# Patient Record
Sex: Female | Born: 1950 | Race: Black or African American | Hispanic: No | Marital: Married | Smoking: Former smoker
Health system: Southern US, Community
[De-identification: ages and names within clinical notes are randomized; demographics above are authoritative.]

---

## 1997-03-12 ENCOUNTER — Ambulatory Visit (HOSPITAL_COMMUNITY): Admission: RE | Admit: 1997-03-12 | Discharge: 1997-03-12 | Payer: Self-pay | Admitting: Obstetrics and Gynecology

## 1997-03-13 ENCOUNTER — Ambulatory Visit (HOSPITAL_COMMUNITY): Admission: RE | Admit: 1997-03-13 | Discharge: 1997-03-13 | Payer: Self-pay | Admitting: Dermatology

## 1997-03-17 ENCOUNTER — Ambulatory Visit (HOSPITAL_COMMUNITY): Admission: RE | Admit: 1997-03-17 | Discharge: 1997-03-17 | Payer: Self-pay | Admitting: Obstetrics and Gynecology

## 1997-09-25 ENCOUNTER — Ambulatory Visit (HOSPITAL_COMMUNITY): Admission: RE | Admit: 1997-09-25 | Discharge: 1997-09-25 | Payer: Self-pay | Admitting: Obstetrics and Gynecology

## 1997-12-24 ENCOUNTER — Ambulatory Visit (HOSPITAL_COMMUNITY): Admission: RE | Admit: 1997-12-24 | Discharge: 1997-12-24 | Payer: Self-pay | Admitting: Obstetrics and Gynecology

## 1998-03-11 ENCOUNTER — Other Ambulatory Visit: Admission: RE | Admit: 1998-03-11 | Discharge: 1998-03-11 | Payer: Self-pay | Admitting: Obstetrics and Gynecology

## 1998-07-07 ENCOUNTER — Encounter: Payer: Self-pay | Admitting: Obstetrics and Gynecology

## 1998-07-07 ENCOUNTER — Ambulatory Visit (HOSPITAL_COMMUNITY): Admission: RE | Admit: 1998-07-07 | Discharge: 1998-07-07 | Payer: Self-pay | Admitting: Obstetrics and Gynecology

## 1999-10-27 ENCOUNTER — Other Ambulatory Visit: Admission: RE | Admit: 1999-10-27 | Discharge: 1999-10-27 | Payer: Self-pay | Admitting: Obstetrics and Gynecology

## 2001-06-06 ENCOUNTER — Other Ambulatory Visit: Admission: RE | Admit: 2001-06-06 | Discharge: 2001-06-06 | Payer: Self-pay | Admitting: Obstetrics and Gynecology

## 2001-06-12 ENCOUNTER — Encounter: Payer: Self-pay | Admitting: Obstetrics and Gynecology

## 2001-06-12 ENCOUNTER — Ambulatory Visit (HOSPITAL_COMMUNITY): Admission: RE | Admit: 2001-06-12 | Discharge: 2001-06-12 | Payer: Self-pay | Admitting: Obstetrics and Gynecology

## 2002-04-24 ENCOUNTER — Other Ambulatory Visit: Admission: RE | Admit: 2002-04-24 | Discharge: 2002-04-24 | Payer: Self-pay | Admitting: Obstetrics and Gynecology

## 2002-10-01 ENCOUNTER — Encounter: Payer: Self-pay | Admitting: Obstetrics and Gynecology

## 2002-10-01 ENCOUNTER — Ambulatory Visit (HOSPITAL_COMMUNITY): Admission: RE | Admit: 2002-10-01 | Discharge: 2002-10-01 | Payer: Self-pay | Admitting: Obstetrics and Gynecology

## 2003-11-11 ENCOUNTER — Other Ambulatory Visit: Admission: RE | Admit: 2003-11-11 | Discharge: 2003-11-11 | Payer: Self-pay | Admitting: Obstetrics and Gynecology

## 2003-12-08 ENCOUNTER — Ambulatory Visit (HOSPITAL_COMMUNITY): Admission: RE | Admit: 2003-12-08 | Discharge: 2003-12-08 | Payer: Self-pay | Admitting: Obstetrics and Gynecology

## 2005-03-21 ENCOUNTER — Ambulatory Visit (HOSPITAL_COMMUNITY): Admission: RE | Admit: 2005-03-21 | Discharge: 2005-03-21 | Payer: Self-pay | Admitting: Obstetrics and Gynecology

## 2005-05-06 ENCOUNTER — Ambulatory Visit (HOSPITAL_COMMUNITY): Admission: RE | Admit: 2005-05-06 | Discharge: 2005-05-06 | Payer: Self-pay | Admitting: Allergy

## 2005-12-07 ENCOUNTER — Other Ambulatory Visit: Admission: RE | Admit: 2005-12-07 | Discharge: 2005-12-07 | Payer: Self-pay | Admitting: Obstetrics and Gynecology

## 2007-04-18 ENCOUNTER — Ambulatory Visit (HOSPITAL_COMMUNITY): Admission: RE | Admit: 2007-04-18 | Discharge: 2007-04-18 | Payer: Self-pay | Admitting: Family Medicine

## 2007-04-20 ENCOUNTER — Ambulatory Visit (HOSPITAL_COMMUNITY): Admission: RE | Admit: 2007-04-20 | Discharge: 2007-04-20 | Payer: Self-pay | Admitting: Family Medicine

## 2008-04-21 ENCOUNTER — Ambulatory Visit (HOSPITAL_COMMUNITY): Admission: RE | Admit: 2008-04-21 | Discharge: 2008-04-21 | Payer: Self-pay | Admitting: Obstetrics and Gynecology

## 2008-06-24 ENCOUNTER — Encounter: Admission: RE | Admit: 2008-06-24 | Discharge: 2008-06-24 | Payer: Self-pay | Admitting: Obstetrics and Gynecology

## 2010-02-21 ENCOUNTER — Encounter: Payer: Self-pay | Admitting: Obstetrics and Gynecology

## 2010-03-04 ENCOUNTER — Other Ambulatory Visit (HOSPITAL_COMMUNITY): Payer: Self-pay | Admitting: Obstetrics and Gynecology

## 2010-03-04 DIAGNOSIS — Z139 Encounter for screening, unspecified: Secondary | ICD-10-CM

## 2010-03-08 ENCOUNTER — Ambulatory Visit (HOSPITAL_COMMUNITY)
Admission: RE | Admit: 2010-03-08 | Discharge: 2010-03-08 | Disposition: A | Discharge status: Home / Self Care | Payer: BC Managed Care – PPO | Source: Ambulatory Visit | Attending: Obstetrics and Gynecology | Admitting: Obstetrics and Gynecology

## 2010-03-08 DIAGNOSIS — Z1231 Encounter for screening mammogram for malignant neoplasm of breast: Secondary | ICD-10-CM | POA: Insufficient documentation

## 2010-03-08 DIAGNOSIS — Z139 Encounter for screening, unspecified: Secondary | ICD-10-CM

## 2013-11-18 ENCOUNTER — Other Ambulatory Visit (HOSPITAL_COMMUNITY): Payer: Self-pay | Admitting: Family Medicine

## 2013-11-18 DIAGNOSIS — Z1231 Encounter for screening mammogram for malignant neoplasm of breast: Secondary | ICD-10-CM

## 2013-11-25 ENCOUNTER — Ambulatory Visit (HOSPITAL_COMMUNITY)
Admission: RE | Admit: 2013-11-25 | Discharge: 2013-11-25 | Disposition: A | Discharge status: Home / Self Care | Payer: PRIVATE HEALTH INSURANCE | Source: Ambulatory Visit | Attending: Family Medicine | Admitting: Family Medicine

## 2013-11-25 DIAGNOSIS — Z1231 Encounter for screening mammogram for malignant neoplasm of breast: Secondary | ICD-10-CM | POA: Insufficient documentation

## 2015-12-10 ENCOUNTER — Encounter: Payer: PRIVATE HEALTH INSURANCE | Admitting: Neurology

## 2015-12-17 ENCOUNTER — Encounter: Payer: PRIVATE HEALTH INSURANCE | Admitting: Neurology

## 2016-10-26 DIAGNOSIS — L503 Dermatographic urticaria: Secondary | ICD-10-CM | POA: Insufficient documentation

## 2017-02-22 DIAGNOSIS — Z9109 Other allergy status, other than to drugs and biological substances: Secondary | ICD-10-CM | POA: Diagnosis not present

## 2017-02-22 DIAGNOSIS — L509 Urticaria, unspecified: Secondary | ICD-10-CM | POA: Diagnosis not present

## 2017-02-22 DIAGNOSIS — L281 Prurigo nodularis: Secondary | ICD-10-CM | POA: Diagnosis not present

## 2017-02-22 DIAGNOSIS — L503 Dermatographic urticaria: Secondary | ICD-10-CM | POA: Diagnosis not present

## 2017-02-22 DIAGNOSIS — L209 Atopic dermatitis, unspecified: Secondary | ICD-10-CM | POA: Diagnosis not present

## 2017-03-07 DIAGNOSIS — I10 Essential (primary) hypertension: Secondary | ICD-10-CM | POA: Diagnosis not present

## 2017-03-07 DIAGNOSIS — R7309 Other abnormal glucose: Secondary | ICD-10-CM | POA: Diagnosis not present

## 2017-03-07 DIAGNOSIS — G4700 Insomnia, unspecified: Secondary | ICD-10-CM | POA: Diagnosis not present

## 2017-03-07 DIAGNOSIS — E781 Pure hyperglyceridemia: Secondary | ICD-10-CM | POA: Diagnosis not present

## 2017-03-09 DIAGNOSIS — I10 Essential (primary) hypertension: Secondary | ICD-10-CM | POA: Diagnosis not present

## 2017-03-09 DIAGNOSIS — E782 Mixed hyperlipidemia: Secondary | ICD-10-CM | POA: Diagnosis not present

## 2017-03-09 DIAGNOSIS — R7309 Other abnormal glucose: Secondary | ICD-10-CM | POA: Diagnosis not present

## 2017-03-24 DIAGNOSIS — R7309 Other abnormal glucose: Secondary | ICD-10-CM | POA: Diagnosis not present

## 2017-03-24 DIAGNOSIS — I10 Essential (primary) hypertension: Secondary | ICD-10-CM | POA: Diagnosis not present

## 2017-03-24 DIAGNOSIS — E782 Mixed hyperlipidemia: Secondary | ICD-10-CM | POA: Diagnosis not present

## 2017-04-25 DIAGNOSIS — J069 Acute upper respiratory infection, unspecified: Secondary | ICD-10-CM | POA: Diagnosis not present

## 2017-07-21 ENCOUNTER — Other Ambulatory Visit: Payer: Self-pay

## 2017-07-21 DIAGNOSIS — I10 Essential (primary) hypertension: Secondary | ICD-10-CM | POA: Diagnosis not present

## 2017-07-21 DIAGNOSIS — R7309 Other abnormal glucose: Secondary | ICD-10-CM | POA: Diagnosis not present

## 2017-07-21 DIAGNOSIS — E782 Mixed hyperlipidemia: Secondary | ICD-10-CM | POA: Diagnosis not present

## 2017-07-21 NOTE — Patient Outreach (Signed)
[facility]) Care Management  07/21/2017  Virginia Turner May 28, 1950 [id number]   Medication Adherence call to Mrs. Virginia Turner left a message for patient to call back patient is due on Rosuvastatin 20 mg and Valsartan Hctz 160/25. Mrs. Virginia Turner is showing past due under [facility].    Virginia AbedAna Turner CPhT Pharmacy Technician [facility] Care Management Direct Dial [phone]  Fax [phone] [email or web address]

## 2017-07-24 DIAGNOSIS — I10 Essential (primary) hypertension: Secondary | ICD-10-CM | POA: Diagnosis not present

## 2017-07-24 DIAGNOSIS — R7309 Other abnormal glucose: Secondary | ICD-10-CM | POA: Diagnosis not present

## 2017-07-24 DIAGNOSIS — E782 Mixed hyperlipidemia: Secondary | ICD-10-CM | POA: Diagnosis not present

## 2017-08-04 DIAGNOSIS — Z0000 Encounter for general adult medical examination without abnormal findings: Secondary | ICD-10-CM | POA: Diagnosis not present

## 2017-08-22 ENCOUNTER — Other Ambulatory Visit: Payer: Self-pay

## 2017-08-22 NOTE — Patient Outreach (Signed)
[facility]) Care Management  08/22/2017  Virginia CulverBenita L Turner 1950-05-12 [id number]   Medication Adherence call to [name] spoke with patient she ask if we can contact doctor's office for more refill she also wants a 90 days supply on both medication Rosuvastatin 20 mg and Valsartan/ Hctz 100/25.left a message at doctor's office for [facility]. Patient is showing past due under [facility].   Lillia AbedAna Ollison-Moran CPhT Pharmacy Technician [facility] Care Management Direct Dial [phone]  Fax [phone] [email or web address]

## 2017-10-16 DIAGNOSIS — Z1211 Encounter for screening for malignant neoplasm of colon: Secondary | ICD-10-CM | POA: Diagnosis not present

## 2017-11-14 DIAGNOSIS — Z1231 Encounter for screening mammogram for malignant neoplasm of breast: Secondary | ICD-10-CM | POA: Diagnosis not present

## 2017-11-17 DIAGNOSIS — R922 Inconclusive mammogram: Secondary | ICD-10-CM | POA: Diagnosis not present

## 2017-11-20 DIAGNOSIS — E781 Pure hyperglyceridemia: Secondary | ICD-10-CM | POA: Diagnosis not present

## 2017-11-20 DIAGNOSIS — E782 Mixed hyperlipidemia: Secondary | ICD-10-CM | POA: Diagnosis not present

## 2017-11-20 DIAGNOSIS — I10 Essential (primary) hypertension: Secondary | ICD-10-CM | POA: Diagnosis not present

## 2017-11-20 DIAGNOSIS — R799 Abnormal finding of blood chemistry, unspecified: Secondary | ICD-10-CM | POA: Diagnosis not present

## 2017-11-20 DIAGNOSIS — R7309 Other abnormal glucose: Secondary | ICD-10-CM | POA: Diagnosis not present

## 2017-11-22 DIAGNOSIS — I10 Essential (primary) hypertension: Secondary | ICD-10-CM | POA: Diagnosis not present

## 2017-11-22 DIAGNOSIS — E1100 Type 2 diabetes mellitus with hyperosmolarity without nonketotic hyperglycemic-hyperosmolar coma (NKHHC): Secondary | ICD-10-CM | POA: Diagnosis not present

## 2017-11-22 DIAGNOSIS — E785 Hyperlipidemia, unspecified: Secondary | ICD-10-CM | POA: Diagnosis not present

## 2018-01-15 DIAGNOSIS — J069 Acute upper respiratory infection, unspecified: Secondary | ICD-10-CM | POA: Diagnosis not present

## 2018-02-15 DIAGNOSIS — J9801 Acute bronchospasm: Secondary | ICD-10-CM | POA: Diagnosis not present

## 2018-02-28 DIAGNOSIS — E785 Hyperlipidemia, unspecified: Secondary | ICD-10-CM | POA: Diagnosis not present

## 2018-02-28 DIAGNOSIS — G4700 Insomnia, unspecified: Secondary | ICD-10-CM | POA: Diagnosis not present

## 2018-02-28 DIAGNOSIS — R7309 Other abnormal glucose: Secondary | ICD-10-CM | POA: Diagnosis not present

## 2018-02-28 DIAGNOSIS — I10 Essential (primary) hypertension: Secondary | ICD-10-CM | POA: Diagnosis not present

## 2018-02-28 DIAGNOSIS — E1100 Type 2 diabetes mellitus with hyperosmolarity without nonketotic hyperglycemic-hyperosmolar coma (NKHHC): Secondary | ICD-10-CM | POA: Diagnosis not present

## 2018-03-02 DIAGNOSIS — E782 Mixed hyperlipidemia: Secondary | ICD-10-CM | POA: Diagnosis not present

## 2018-03-02 DIAGNOSIS — I10 Essential (primary) hypertension: Secondary | ICD-10-CM | POA: Diagnosis not present

## 2018-04-05 ENCOUNTER — Other Ambulatory Visit: Payer: Self-pay

## 2018-04-05 NOTE — Patient Outreach (Signed)
[facility]) Care Management  04/05/2018  Virginia Turner 03-11-1950 [id number]   Medication Adherence call to [name] patient did not answer patient is due on Janumet XR 50/500 mg, pharmacy said last time she pick up was on 2/11 for a 23 tablets. [name] is showing past due under united Health Care Ins.   Lillia Abed CPhT Pharmacy Technician [facility] Care Management Direct Dial [phone]  Fax [phone] [email or web address]

## 2018-05-21 ENCOUNTER — Other Ambulatory Visit: Payer: Self-pay

## 2018-05-21 NOTE — Patient Outreach (Signed)
[facility]) Care Management  05/21/2018  Virginia Turner 06-Sep-1950 [id number]   Medication Adherence call to Mrs. Virginia Turner [employer] Verify spoke with patient she is due on Janumet Rx 50/500 mg patient explain she already order and will be pick up from the pharmacy patient also said medication has gone up and wants to know if she can apply for Patients Assistance I will send it to Caryn Bee rph to better assist her with this. Virginia Turner is showing past due under [facility] Ins.

## 2018-06-07 ENCOUNTER — Other Ambulatory Visit: Payer: Self-pay | Admitting: Pharmacist

## 2018-06-07 ENCOUNTER — Other Ambulatory Visit: Payer: Self-pay | Admitting: Pharmacy Technician

## 2018-06-07 ENCOUNTER — Other Ambulatory Visit: Payer: Self-pay

## 2018-06-07 NOTE — Patient Outreach (Addendum)
[facility]) Care Management  [facility] Pharmacy   06/07/2018  Virginia Turner 1950-12-17 [id number]  Subjective:  Message received from pharmacy technician, Darien Ramus, patient needs assistance paying for Janumet XR, prescribed by Dr Lowella Bandy.    Successful phone outreach to patient, HIPAA details verified.  Patient expresses Janumet XR and Vascepa are expensive.  In fact patient reports she is supposed to take Vascepa 1 capsule 3 times daily but only taking 1 capsule once daily due to cost.       Objective:   Current Medications: Current Outpatient Medications  Medication Sig Dispense Refill  . Icosapent Ethyl (VASCEPA) 1 g CAPS Take 1 capsule by mouth daily.    . rosuvastatin (CRESTOR) 40 MG tablet Take 40 mg by mouth daily.    . SitaGLIPtin-MetFORMIN HCl (JANUMET XR) 50-500 MG TB24 Take 1 tablet by mouth daily.    . valsartan-hydrochlorothiazide (DIOVAN-HCT) 160-25 MG tablet Take 1 tablet by mouth daily.     No current facility-administered medications for this visit.     Assessment:  1) Medication adherence---due to cost, patient unable to be able adherent to Vascepa per her report.   Patient consented to phone call to [facility].  Contacted [employer] as a three way call with patient---patient was approved for a $2,500 grant from [employer] for hypercholesterolemia to assist with co-pay.  Patient was provided with the pharmacy card information by [facility] representative and representative instructed patient she will receive a letter with the details of her grant in a bright orange envelope in the mail in the next 7-10 business days.   2) Janumet XR cost: -Discussed [employer] Patient assistance program---program believes income will meet requirements and would like to apply to see if eligible.   Patient understands there is no guarantee she will be eligible.    Plan:  Will notify Dr Parke Simmers patient has been unable to be  adherent to Vascepa due to cost and this has been remedied with [employer].   Will have pharmacy technician Jill assist patient with compiling [employer] patient assistance application.   Address in this chart verified as accurate per patient.   Note routed to Dr Parke Simmers.   Tommye Standard, PharmD, [facility] Clinical Pharmacist [facility] [phone]

## 2018-06-07 NOTE — Patient Outreach (Signed)
[facility]) Care Management  06/07/2018  Virginia Turner 08-Jun-1950 [id number]   Medication Adherence call to Virginia Turner [employer] Verify spoke with patient she is due on Rosuvastatin 40 mg she explain she is taking 1 tablet daily and has plenty for about 3 month patient did ask again on Janumet 5/500 mg she wants to know if she can get Patient's assistance on this medication told her kevin(rph) will give her a call back. Virginia Turner is showing past due under [facility] Ins.   Lillia Abed CPhT Pharmacy Technician [facility] Management Direct Dial [phone]  Fax [phone] [email or web address]

## 2018-06-07 NOTE — Patient Outreach (Signed)
[facility]) Care Management  06/07/2018  Virginia Turner 01-18-1951 [id number]                           Medication Assistance Referral  Referral From: [facility] RPh Teena Irani  Medication/Company: Thera Flake XR / [employer] Patient application portion:  Mailed Provider application portion:  Mailed to Dr. Renaye Rakers    Follow up:  Will follow up with patient in 5-10 business days to confirm application(s) have been received.  [name] P. [name], CPhT Musician Care Management [phone]

## 2018-06-18 ENCOUNTER — Other Ambulatory Visit: Payer: Self-pay | Admitting: Pharmacy Technician

## 2018-06-18 NOTE — Patient Outreach (Signed)
[facility]) Care Management  06/18/2018  Virginia Turner [birth year] [id number]   Unsuccessful outreach call placed to patient in regards to [employer] application for Janumet XR.  Unfortunately patient did not answer the phone, a voicemail box had not been set up so a voicemail could not be left. Attempted to call the mobile number listed for the patient, but the person's voicemail name did not match the patient's name.  Was calling patient to inquire if she had received the application that was mailed to her on 06/07/2018.  Will followup with patient in 3-5 business days.  Ed Mandich P. Kambrey Hagger, CPhT Musician Care Management [phone]

## 2018-06-21 ENCOUNTER — Other Ambulatory Visit: Payer: Self-pay | Admitting: Pharmacy Technician

## 2018-06-21 NOTE — Patient Outreach (Signed)
[facility]) Care Management  06/21/2018  Virginia Turner 06/29/1950 [id number]  Successful outreach call placed to patient in regards to [employer] patient assistance application for Janumet XR.  Spoke to patient, HIPAA identifiers verified.  Patient informed she had received the application and placed it in the mail last week.  Will followup with patient in 7-14 business days if application has not been received.  Ginia Rudell P. Charlen Bakula, CPhT Musician Care Management [phone]

## 2018-06-27 DIAGNOSIS — R7309 Other abnormal glucose: Secondary | ICD-10-CM | POA: Diagnosis not present

## 2018-06-27 DIAGNOSIS — I10 Essential (primary) hypertension: Secondary | ICD-10-CM | POA: Diagnosis not present

## 2018-06-27 DIAGNOSIS — E782 Mixed hyperlipidemia: Secondary | ICD-10-CM | POA: Diagnosis not present

## 2018-06-28 ENCOUNTER — Other Ambulatory Visit: Payer: Self-pay | Admitting: Pharmacy Technician

## 2018-06-28 NOTE — Patient Outreach (Signed)
[facility]) Care Management  06/28/2018  Virginia Turner [birth year] [id number]    Received all necessary documents and signatures from both patient and provider for [employer] patient assistance for Janumet.  Submitted completed application via mail to [employer].  Will followup with [employer] in 10-14 business days to inquire on status of application and to inquire if they have mailed out the attestation form. Patient is aware to be expecting a letter from [employer] and has been informed previously to call me when she receives that letter.  Blakeley Margraf P. Jayveion Stalling, CPhT Musician Care Management [phone]

## 2018-06-29 DIAGNOSIS — E785 Hyperlipidemia, unspecified: Secondary | ICD-10-CM | POA: Diagnosis not present

## 2018-06-29 DIAGNOSIS — I10 Essential (primary) hypertension: Secondary | ICD-10-CM | POA: Diagnosis not present

## 2018-06-29 DIAGNOSIS — E1169 Type 2 diabetes mellitus with other specified complication: Secondary | ICD-10-CM | POA: Diagnosis not present

## 2018-07-11 ENCOUNTER — Other Ambulatory Visit: Payer: Self-pay | Admitting: Pharmacy Technician

## 2018-07-11 NOTE — Patient Outreach (Signed)
[place] [facility]) Care Management  07/11/2018  Virginia Turner Jun 03, 1950 [id number]   Incoming call received from patient in regards to [employer] patient assistance application for Janument XR.  Spoke to patient, HIPAA identifiers verified.  Patient was calling to inform me that she received her [employer] Naval architect. Discussed the attestation letter with the patient. Patient was able to complete the questions. Patent informed she would place in the mail for pickup tommorrow   Will followup with [employer] in 7-10 business days to inquire if they have received the attestation letter back.  Maleka Contino P. Holdan Stucke, [place] Management [phone]

## 2018-07-19 DIAGNOSIS — E119 Type 2 diabetes mellitus without complications: Secondary | ICD-10-CM | POA: Diagnosis not present

## 2018-08-01 ENCOUNTER — Other Ambulatory Visit: Payer: Self-pay | Admitting: Pharmacy Technician

## 2018-08-01 NOTE — Patient Outreach (Signed)
[place] [facility]) Care Management  08/01/2018  Virginia Turner 29-May-1950 [id number]   Care coordination call placed to [employer] in regards to patient's application for Janumet XR.  Spoke to [place] who informed patient was APPROVED for the program 6/152929-01/31/2019. Adonis Huguenin informed patient should be receiving a 90 days supply of the medication on 08/02/2018.  Will followup with patient in 3-5 business days to confirm receipt of medication.  Virginia Turner, [place] Management [phone]

## 2018-08-06 ENCOUNTER — Other Ambulatory Visit: Payer: Self-pay | Admitting: Pharmacy Technician

## 2018-08-06 NOTE — Patient Outreach (Signed)
[place] [facility]) Care Management  08/06/2018  Virginia Turner 23-Mar-1950 [id number]    Successful outreach call placed to patient in regards to [employer] application for Janumet XR.  Spoke to patient, HIPAA identifiers verified.  Patient informed she has not received the Janumet. Informed patient that the representative form [employer] said the medication should have arrived on 08/02/2018. Informed patient I would call [employer].  Ismelda Weatherman P. Reaghan Kawa, [place] Management [phone]

## 2018-08-06 NOTE — Patient Outreach (Signed)
[place] [facility]) Care Management  08/06/2018  Virginia Turner [birth year] [id number]   ADDENDUM  Successful outreach call placed to patient in regards to Janumet XR application with [employer].  Spoke to patient, HIPAA identifiers verified.  Informed patient that the [employer] representative says her package should arrive today. Patient to outreach me once she has received the medication.  Will followup with patient in 3-7 business days if call is not returned.  [name] P. Celise Bazar, [place] Management [phone]

## 2018-08-06 NOTE — Patient Outreach (Signed)
[place] [facility]) Care Management  08/06/2018  Virginia Turner 01-Apr-1950 [id number]    Care coordination call placed to [employer] in regards to patient's Janumet application.  Spoke to Angelica who said the package is estimated to arrive by end of business today via [place]. The tracking number is [id number].  Will outreach patient with this information.  Luciann Gossett P. Kaityln Kallstrom, [place] Management [phone]

## 2018-08-07 ENCOUNTER — Other Ambulatory Visit: Payer: Self-pay | Admitting: Pharmacy Technician

## 2018-08-07 NOTE — Patient Outreach (Signed)
[place] [facility]) Care Management  08/07/2018  NIOKA THORINGTON [birth year] [id number]   Incoming call received from patient, HIPAA identifiers verified.  Patient was returning call from yesterday. Patient called to inform that she received 90 days supply of Janumet XR. Discussed refill procedure with patient. Patient verbalized understanding.  Inquired if patient had any other questions or concerns for myself or pharmacist and patient informed no. Confirmed patient had our name and number for future reference.  Will route note to [place] that patient assistance has been completed and case can be closed. Will remove myself from care team.  Luiz Ochoa. Mort Smelser, [place] Management [phone]

## 2018-08-13 ENCOUNTER — Other Ambulatory Visit: Payer: Self-pay | Admitting: Pharmacist

## 2018-08-13 NOTE — Patient Outreach (Signed)
[place] [facility]) Care Management  08/13/2018  Virginia Turner 1950-08-18 [id number]  Received message from pharmacy technician Sharee Pimple that patient has received Janumet XR via [employer] patient assistance program, see note in this chart from 08/07/2018.  Patient was initially referred for medication assistance evaluation and this has now been successfully obtained for Vascepa and Janumet XR.   Plan:  Case closed.   Will send update to primary care provider, Dr Clayburn Pert. Karrie Meres, PharmD, [place] [phone]

## 2018-09-25 ENCOUNTER — Other Ambulatory Visit: Payer: Self-pay

## 2018-09-25 NOTE — Patient Outreach (Signed)
[place] [facility]) Care Management  09/25/2018  Virginia Turner [birth year] [id number]   Medication Adherence call to Virginia Turner [employer] Verify spoke with patient she is past due on Rosuvastatin 40 mg patient explain she take 1 tablet daily and has a few left she also has one ready at the pharmacy and will pick up soon. Virginia Turner is showing past due under [place].   [place] Management Direct Dial [phone]  Fax [phone] [email or web address]

## 2018-10-16 ENCOUNTER — Other Ambulatory Visit: Payer: Self-pay

## 2018-10-16 NOTE — Patient Outreach (Signed)
[place]) Care Management  10/16/2018  Virginia Turner July 12, 1950 [id number]   Medication Adherence call to Virginia Turner [employer] Verify spoke with patient she is past due on Rosuvastatin 40 mg patient explain she takes 1 tablet daily and has enough for 7 more days patient [facility] this medication along Valsartan/Hctz.[facility] patient. Virginia Turner is showing up past due under [place].   [place] Management Direct Dial [phone]  Fax [phone] [email or web address]

## 2018-10-24 DIAGNOSIS — E1169 Type 2 diabetes mellitus with other specified complication: Secondary | ICD-10-CM | POA: Diagnosis not present

## 2018-10-24 DIAGNOSIS — I10 Essential (primary) hypertension: Secondary | ICD-10-CM | POA: Diagnosis not present

## 2018-10-24 DIAGNOSIS — E785 Hyperlipidemia, unspecified: Secondary | ICD-10-CM | POA: Diagnosis not present

## 2018-10-26 DIAGNOSIS — I10 Essential (primary) hypertension: Secondary | ICD-10-CM | POA: Diagnosis not present

## 2018-10-26 DIAGNOSIS — G5602 Carpal tunnel syndrome, left upper limb: Secondary | ICD-10-CM | POA: Diagnosis not present

## 2018-10-26 DIAGNOSIS — E1169 Type 2 diabetes mellitus with other specified complication: Secondary | ICD-10-CM | POA: Diagnosis not present

## 2019-01-22 DIAGNOSIS — R200 Anesthesia of skin: Secondary | ICD-10-CM | POA: Diagnosis not present

## 2019-01-31 DIAGNOSIS — Z1231 Encounter for screening mammogram for malignant neoplasm of breast: Secondary | ICD-10-CM | POA: Diagnosis not present

## 2019-01-31 DIAGNOSIS — M810 Age-related osteoporosis without current pathological fracture: Secondary | ICD-10-CM | POA: Diagnosis not present

## 2019-02-21 DIAGNOSIS — I10 Essential (primary) hypertension: Secondary | ICD-10-CM | POA: Diagnosis not present

## 2019-02-21 DIAGNOSIS — E782 Mixed hyperlipidemia: Secondary | ICD-10-CM | POA: Diagnosis not present

## 2019-02-21 DIAGNOSIS — R200 Anesthesia of skin: Secondary | ICD-10-CM | POA: Diagnosis not present

## 2019-02-21 DIAGNOSIS — E1169 Type 2 diabetes mellitus with other specified complication: Secondary | ICD-10-CM | POA: Diagnosis not present

## 2019-02-21 DIAGNOSIS — G5602 Carpal tunnel syndrome, left upper limb: Secondary | ICD-10-CM | POA: Diagnosis not present

## 2019-02-25 DIAGNOSIS — E1169 Type 2 diabetes mellitus with other specified complication: Secondary | ICD-10-CM | POA: Diagnosis not present

## 2019-02-25 DIAGNOSIS — I10 Essential (primary) hypertension: Secondary | ICD-10-CM | POA: Diagnosis not present

## 2019-02-25 DIAGNOSIS — E785 Hyperlipidemia, unspecified: Secondary | ICD-10-CM | POA: Diagnosis not present

## 2019-06-21 DIAGNOSIS — E1169 Type 2 diabetes mellitus with other specified complication: Secondary | ICD-10-CM | POA: Diagnosis not present

## 2019-06-21 DIAGNOSIS — I10 Essential (primary) hypertension: Secondary | ICD-10-CM | POA: Diagnosis not present

## 2019-06-21 DIAGNOSIS — E785 Hyperlipidemia, unspecified: Secondary | ICD-10-CM | POA: Diagnosis not present

## 2019-06-25 DIAGNOSIS — I10 Essential (primary) hypertension: Secondary | ICD-10-CM | POA: Diagnosis not present

## 2019-06-25 DIAGNOSIS — E1169 Type 2 diabetes mellitus with other specified complication: Secondary | ICD-10-CM | POA: Diagnosis not present

## 2019-06-25 DIAGNOSIS — E782 Mixed hyperlipidemia: Secondary | ICD-10-CM | POA: Diagnosis not present

## 2019-06-25 DIAGNOSIS — L259 Unspecified contact dermatitis, unspecified cause: Secondary | ICD-10-CM | POA: Diagnosis not present

## 2019-06-26 ENCOUNTER — Other Ambulatory Visit: Payer: Self-pay | Admitting: Pharmacy Technician

## 2019-06-26 NOTE — Patient Outreach (Signed)
[facility]) Care Management  06/26/2019  Virginia Turner 09/28/1950 [id number]  Received the following email from Caron Presume, AA with CM: Good afternoon Virginia Turner, I received a call from [name] (DOB: January 30, 1951).  Her phone number is [phone].  She would like a call back regarding some prescription questions she has. Have a good afternoon. ?? Thank you, Caron Presume  Successful outreach call placed to patient, HIPAA identfers verified.  Patient informed she was in need of renewing her application for Janumet with [employer] and Vascepa with the [name]. Patient informed she now has [name] and her primary care provider was [name]. Inquired when patient had last appointment with PCP as they have an embedded [employer] pharmacist in the clinic. Patient informed she was there this week and she spoke to the pharmacist. Informed patient to outreach that pharmacist for assistance. Informed patient to outreach me if that pharmacist was unable to assist the patient. Patient verbalized understanding and appreciated the call back.  Tericka Devincenzi P. [place], CPhT [facility] [employer]  [phone]

## 2019-07-01 DIAGNOSIS — E1100 Type 2 diabetes mellitus with hyperosmolarity without nonketotic hyperglycemic-hyperosmolar coma (NKHHC): Secondary | ICD-10-CM | POA: Diagnosis not present

## 2019-07-01 DIAGNOSIS — I10 Essential (primary) hypertension: Secondary | ICD-10-CM | POA: Diagnosis not present

## 2019-07-01 DIAGNOSIS — E785 Hyperlipidemia, unspecified: Secondary | ICD-10-CM | POA: Diagnosis not present

## 2019-07-01 DIAGNOSIS — Z7984 Long term (current) use of oral hypoglycemic drugs: Secondary | ICD-10-CM | POA: Diagnosis not present

## 2019-10-08 DIAGNOSIS — Z0000 Encounter for general adult medical examination without abnormal findings: Secondary | ICD-10-CM | POA: Diagnosis not present

## 2019-10-08 DIAGNOSIS — L237 Allergic contact dermatitis due to plants, except food: Secondary | ICD-10-CM | POA: Diagnosis not present

## 2019-10-17 DIAGNOSIS — H01131 Eczematous dermatitis of right upper eyelid: Secondary | ICD-10-CM | POA: Diagnosis not present

## 2019-10-17 DIAGNOSIS — L237 Allergic contact dermatitis due to plants, except food: Secondary | ICD-10-CM | POA: Diagnosis not present

## 2019-10-17 DIAGNOSIS — H01132 Eczematous dermatitis of right lower eyelid: Secondary | ICD-10-CM | POA: Diagnosis not present

## 2019-10-17 DIAGNOSIS — Z961 Presence of intraocular lens: Secondary | ICD-10-CM | POA: Diagnosis not present

## 2019-10-17 DIAGNOSIS — Z7189 Other specified counseling: Secondary | ICD-10-CM | POA: Diagnosis not present

## 2019-10-28 DIAGNOSIS — E1169 Type 2 diabetes mellitus with other specified complication: Secondary | ICD-10-CM | POA: Diagnosis not present

## 2019-10-28 DIAGNOSIS — I10 Essential (primary) hypertension: Secondary | ICD-10-CM | POA: Diagnosis not present

## 2019-10-28 DIAGNOSIS — E7800 Pure hypercholesterolemia, unspecified: Secondary | ICD-10-CM | POA: Diagnosis not present

## 2019-10-28 DIAGNOSIS — Z23 Encounter for immunization: Secondary | ICD-10-CM | POA: Diagnosis not present

## 2020-01-20 ENCOUNTER — Ambulatory Visit: Payer: PRIVATE HEALTH INSURANCE

## 2020-01-31 DIAGNOSIS — E785 Hyperlipidemia, unspecified: Secondary | ICD-10-CM | POA: Diagnosis not present

## 2020-01-31 DIAGNOSIS — Z7984 Long term (current) use of oral hypoglycemic drugs: Secondary | ICD-10-CM | POA: Diagnosis not present

## 2020-01-31 DIAGNOSIS — I10 Essential (primary) hypertension: Secondary | ICD-10-CM | POA: Diagnosis not present

## 2020-01-31 DIAGNOSIS — E1100 Type 2 diabetes mellitus with hyperosmolarity without nonketotic hyperglycemic-hyperosmolar coma (NKHHC): Secondary | ICD-10-CM | POA: Diagnosis not present

## 2020-03-06 DIAGNOSIS — E785 Hyperlipidemia, unspecified: Secondary | ICD-10-CM | POA: Diagnosis not present

## 2020-03-06 DIAGNOSIS — E1100 Type 2 diabetes mellitus with hyperosmolarity without nonketotic hyperglycemic-hyperosmolar coma (NKHHC): Secondary | ICD-10-CM | POA: Diagnosis not present

## 2020-03-06 DIAGNOSIS — I10 Essential (primary) hypertension: Secondary | ICD-10-CM | POA: Diagnosis not present

## 2020-03-09 DIAGNOSIS — E1169 Type 2 diabetes mellitus with other specified complication: Secondary | ICD-10-CM | POA: Diagnosis not present

## 2020-03-09 DIAGNOSIS — G4700 Insomnia, unspecified: Secondary | ICD-10-CM | POA: Diagnosis not present

## 2020-03-09 DIAGNOSIS — E782 Mixed hyperlipidemia: Secondary | ICD-10-CM | POA: Diagnosis not present

## 2020-03-09 DIAGNOSIS — Z7984 Long term (current) use of oral hypoglycemic drugs: Secondary | ICD-10-CM | POA: Diagnosis not present

## 2020-03-09 DIAGNOSIS — I10 Essential (primary) hypertension: Secondary | ICD-10-CM | POA: Diagnosis not present

## 2020-03-09 DIAGNOSIS — E785 Hyperlipidemia, unspecified: Secondary | ICD-10-CM | POA: Diagnosis not present

## 2020-04-06 DIAGNOSIS — Z1231 Encounter for screening mammogram for malignant neoplasm of breast: Secondary | ICD-10-CM | POA: Diagnosis not present

## 2020-06-12 DIAGNOSIS — E785 Hyperlipidemia, unspecified: Secondary | ICD-10-CM | POA: Diagnosis not present

## 2020-06-12 DIAGNOSIS — I10 Essential (primary) hypertension: Secondary | ICD-10-CM | POA: Diagnosis not present

## 2020-06-12 DIAGNOSIS — U071 COVID-19: Secondary | ICD-10-CM | POA: Diagnosis not present

## 2020-06-12 DIAGNOSIS — E1169 Type 2 diabetes mellitus with other specified complication: Secondary | ICD-10-CM | POA: Diagnosis not present

## 2020-06-19 DIAGNOSIS — I10 Essential (primary) hypertension: Secondary | ICD-10-CM | POA: Diagnosis not present

## 2020-06-19 DIAGNOSIS — U071 COVID-19: Secondary | ICD-10-CM | POA: Diagnosis not present

## 2020-06-19 DIAGNOSIS — E1169 Type 2 diabetes mellitus with other specified complication: Secondary | ICD-10-CM | POA: Diagnosis not present

## 2020-07-30 DIAGNOSIS — Z7984 Long term (current) use of oral hypoglycemic drugs: Secondary | ICD-10-CM | POA: Diagnosis not present

## 2020-07-30 DIAGNOSIS — E785 Hyperlipidemia, unspecified: Secondary | ICD-10-CM | POA: Diagnosis not present

## 2020-07-30 DIAGNOSIS — I10 Essential (primary) hypertension: Secondary | ICD-10-CM | POA: Diagnosis not present

## 2020-07-30 DIAGNOSIS — E1100 Type 2 diabetes mellitus with hyperosmolarity without nonketotic hyperglycemic-hyperosmolar coma (NKHHC): Secondary | ICD-10-CM | POA: Diagnosis not present

## 2020-08-06 DIAGNOSIS — E119 Type 2 diabetes mellitus without complications: Secondary | ICD-10-CM | POA: Diagnosis not present

## 2020-08-30 DIAGNOSIS — E785 Hyperlipidemia, unspecified: Secondary | ICD-10-CM | POA: Diagnosis not present

## 2020-08-30 DIAGNOSIS — E1100 Type 2 diabetes mellitus with hyperosmolarity without nonketotic hyperglycemic-hyperosmolar coma (NKHHC): Secondary | ICD-10-CM | POA: Diagnosis not present

## 2020-08-30 DIAGNOSIS — I10 Essential (primary) hypertension: Secondary | ICD-10-CM | POA: Diagnosis not present

## 2020-08-30 DIAGNOSIS — Z7984 Long term (current) use of oral hypoglycemic drugs: Secondary | ICD-10-CM | POA: Diagnosis not present

## 2020-09-21 DIAGNOSIS — E785 Hyperlipidemia, unspecified: Secondary | ICD-10-CM | POA: Diagnosis not present

## 2020-09-21 DIAGNOSIS — E1100 Type 2 diabetes mellitus with hyperosmolarity without nonketotic hyperglycemic-hyperosmolar coma (NKHHC): Secondary | ICD-10-CM | POA: Diagnosis not present

## 2020-09-21 DIAGNOSIS — I10 Essential (primary) hypertension: Secondary | ICD-10-CM | POA: Diagnosis not present

## 2020-09-22 DIAGNOSIS — I10 Essential (primary) hypertension: Secondary | ICD-10-CM | POA: Diagnosis not present

## 2020-09-22 DIAGNOSIS — E1169 Type 2 diabetes mellitus with other specified complication: Secondary | ICD-10-CM | POA: Diagnosis not present

## 2020-09-22 DIAGNOSIS — Z0000 Encounter for general adult medical examination without abnormal findings: Secondary | ICD-10-CM | POA: Diagnosis not present

## 2020-09-22 DIAGNOSIS — L237 Allergic contact dermatitis due to plants, except food: Secondary | ICD-10-CM | POA: Diagnosis not present

## 2020-10-08 ENCOUNTER — Ambulatory Visit: Payer: Self-pay | Admitting: Allergy & Immunology

## 2020-10-30 DIAGNOSIS — I10 Essential (primary) hypertension: Secondary | ICD-10-CM | POA: Diagnosis not present

## 2020-10-30 DIAGNOSIS — Z7984 Long term (current) use of oral hypoglycemic drugs: Secondary | ICD-10-CM | POA: Diagnosis not present

## 2020-10-30 DIAGNOSIS — E1100 Type 2 diabetes mellitus with hyperosmolarity without nonketotic hyperglycemic-hyperosmolar coma (NKHHC): Secondary | ICD-10-CM | POA: Diagnosis not present

## 2020-10-30 DIAGNOSIS — E785 Hyperlipidemia, unspecified: Secondary | ICD-10-CM | POA: Diagnosis not present

## 2020-12-03 ENCOUNTER — Other Ambulatory Visit: Payer: Self-pay

## 2020-12-03 ENCOUNTER — Encounter: Payer: Self-pay | Admitting: Allergy & Immunology

## 2020-12-03 ENCOUNTER — Ambulatory Visit: Payer: Medicare Other | Admitting: Allergy & Immunology

## 2020-12-03 VITALS — BP 118/72 | HR 77 | Temp 97.7°F | Resp 16 | Ht 63.0 in | Wt 159.2 lb

## 2020-12-03 DIAGNOSIS — L299 Pruritus, unspecified: Secondary | ICD-10-CM | POA: Diagnosis not present

## 2020-12-03 DIAGNOSIS — L281 Prurigo nodularis: Secondary | ICD-10-CM

## 2020-12-03 DIAGNOSIS — J302 Other seasonal allergic rhinitis: Secondary | ICD-10-CM

## 2020-12-03 NOTE — Progress Notes (Signed)
NEW PATIENT  Date of Service/Encounter:  12/03/20  Consult requested by: Renaye Rakers, MD   Assessment:   Prurigo nodularis  Pruritus - mostly of face without obvious lesions  Seasonal allergic rhinitis (trees, outdoor molds)  Plan/Recommendations:   1. Prurigo nodularis - I would strongly consider starting Dupixent. - It was recently approved for treatment for prurigo nodularis. - We were going to get those outside records from the dermatologist.  2. Pruritus - Testing was only positive to ash tree pollen as well as outdoor mold mix. - We are going to get some labs to rule out weird causes of itching. - We we will call you in 1 to 2 weeks with the results of the testing. - Starting Dupixent would also help with controlling the itching. - In the meantime, start Allegra 1 tablet in the morning and 1 tablet at night.  3. Follow up in two months or earlier if needed.   This note in its entirety was forwarded to the Provider who requested this consultation.  Subjective:   Virginia Turner is a 70 y.o. female presenting today for evaluation of  Chief Complaint  Patient presents with   Pruritis    Says it's all over her body especially her face. Says it started about five years ago. As been seen and treated by her dermatologist  but the treatments have failed.    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  Virginia Turner was referred by Renaye Rakers, MD.     Virginia Turner is a 70 y.o. female presenting for an evaluation of pruritis .   Itching started five years ago. She has been to Dermatologist and has been trated with hydrocortisone cream and clobetasol which did not seem to work. The last one was three years ago. No biopsy was done. It does not seem to  change at all. It becomes itchy when her hair touches it. She moisturizes routinely. She does not use antihistamines.   Review of her Care Everywhere chart shows that she had a visit with Dr. Darreld Mclean in September 2018. She was diagnosed with dermatographia some and prurigo nodularis at that visit.  She was started on Claritin 20 mg in the morning and 10 mg at night in combination with hydroxyzine nightly.  CeraVe a cleanser was recommended.  Her last visit with her was in January 2019.  Allergic Rhinitis Symptom History: She does report a dry nose. She uses saline. She does keep Kleenex around just to have. She will have sinus infections twice annually with the change in the seasons.  She did have COVID19 in April 2022. This caused some sleepiness. She did go in to get some medicine for it. She was tested around 25 years ago. She does not remember the results.   She has  a diagnosis of prurigo nodularis. She was diagnosed with this in 1989 or so. She had steroid injections into the lesions but then they returned again.  She has been on phototherapy for this as well.  She was on Soriatane at some point but experienced dryness of her skin and hair loss which necessitated her stopping.  She is unsure if it helped with the pured go nodularis anyway.  She had a lot  of medications to treat this, but they were too strong and "burned [her] fingerprints" off. The original dermatologist who diagnosed her with prurigo nodularis was someone with [facility]. She did pills, shorts, intralesional steroids, and PUVA therapy. Nothing seems to help. She has not treated it for years and just "dealt with it".   Otherwise, there is no history of other atopic diseases, including asthma, food allergies, drug allergies, stinging insect allergies, or contact dermatitis. There is no significant infectious history. Vaccinations are up to date.    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 If you have any questions, ask your nurse or doctor.          icosapent Ethyl 1 g capsule Commonly known as: VASCEPA Take 1 capsule by mouth daily.   rosuvastatin 40 MG tablet Commonly known as: CRESTOR Take 40 mg by mouth daily.   SitaGLIPtin-MetFORMIN HCl 50-500 MG Tb24 Take 1 tablet by mouth daily.   valsartan-hydrochlorothiazide 160-25 MG tablet Commonly known as: DIOVAN-HCT Take 1 tablet by mouth daily.        Birth History: non-contributory  Developmental History: non-contributory  Past Surgical History: History reviewed. No pertinent surgical history.   Family History: History reviewed. No pertinent family history.   Social History: Virginia Turner lives at home with her family.  She has a house that is 69 years old.  There are hardwoods throughout the home.  They have gas heating and heat pump for cooling.  There is a cat outside of the home.  There are no dust mite covers on the bedding.  There is no tobacco exposure.  There are no dust mite coverings.  She is not exposed to fumes, chemicals, or dust.  There is no HEPA filter in the home.   Review of Systems  Constitutional: Negative.  Negative for chills, fever, malaise/fatigue and weight loss.  HENT: Negative.  Negative for congestion, ear discharge and ear pain.   Eyes:  Negative for pain, discharge and redness.  Respiratory:  Negative for cough, sputum production, shortness of breath and wheezing.   Cardiovascular: Negative.  Negative for chest pain and palpitations.  Gastrointestinal:  Negative for abdominal pain, constipation, diarrhea, heartburn, nausea and vomiting.  Skin:  Positive for itching and rash.  Neurological:  Negative for dizziness and headaches.  Endo/Heme/Allergies:  Negative for  environmental allergies. Does not bruise/bleed easily.      Objective:   Blood pressure 118/72, pulse 77, temperature 97.7 F (36.5 C), temperature source Temporal, resp. rate 16, height 5\' 3"  (1.6 m), weight 159 lb 3.2 oz (72.2 kg), SpO2 97 %. Body mass index is 28.2 kg/m.   Physical Exam:   Physical Exam Constitutional:      Appearance: She is well-developed.  HENT:     Head: Normocephalic and atraumatic.     Right Ear: Tympanic membrane, ear canal and external ear normal. No drainage, swelling or tenderness. Tympanic membrane is not injected, scarred, erythematous, retracted or bulging.     Left Ear: Tympanic membrane, ear canal and external ear normal. No drainage, swelling or tenderness. Tympanic membrane is not injected, scarred, erythematous, retracted or bulging.     Nose: No nasal deformity, septal deviation, mucosal edema or  rhinorrhea.     Right Sinus: No maxillary sinus tenderness or frontal sinus tenderness.     Left Sinus: No maxillary sinus tenderness or frontal sinus tenderness.     Mouth/Throat:     Mouth: Mucous membranes are not pale and not dry.     Pharynx: Uvula midline.  Eyes:     General:        Right eye: No discharge.        Left eye: No discharge.     Conjunctiva/sclera: Conjunctivae normal.     Right eye: Right conjunctiva is not injected. No chemosis.    Left eye: Left conjunctiva is not injected. No chemosis.    Pupils: Pupils are equal, round, and reactive to light.  Cardiovascular:     Rate and Rhythm: Normal rate and regular rhythm.     Heart sounds: Normal heart sounds.  Pulmonary:     Effort: Pulmonary effort is normal. No tachypnea, accessory muscle usage or respiratory distress.     Breath sounds: Normal breath sounds. No wheezing, rhonchi or rales.     Comments: Good air movement throughout.  Chest:     Chest wall: No tenderness.  Abdominal:     Tenderness: There is no abdominal tenderness. There is no guarding or rebound.   Lymphadenopathy:     Head:     Right side of head: No submandibular, tonsillar or occipital adenopathy.     Left side of head: No submandibular, tonsillar or occipital adenopathy.     Cervical: No cervical adenopathy.  Skin:    General: Skin is warm.     Capillary Refill: Capillary refill takes less than 2 seconds.     Coloration: Skin is not pale.     Findings: Rash present. No abrasion, erythema or petechiae. Rash is not papular, urticarial or vesicular.     Comments: There are multiple lesions in various stages of healing on the bilateral arms.  Many are raised, but there is some hyperpigmented lesions which are more flat and smooth.  No oozing or crusting.  Her face, where the itching occurs, looks unremarkable.  There are some hyperpigmented lesions noted.  Neurological:     Mental Status: She is alert.     Diagnostic studies:   Allergy Studies:     Airborne Adult Perc - 12/03/20 1401     Time Antigen Placed 1401    Allergen Manufacturer Waynette Buttery    Location Back    Number of Test 59    1. Control-Buffer 50% Glycerol Negative    2. Control-Histamine 1 mg/ml 2+    3. Albumin saline Negative    4. Bahia Negative    5. French Southern Territories Negative    6. Johnson Negative    7. Kentucky Blue Negative    8. Meadow Fescue Negative    9. Perennial Rye Negative    10. Sweet Vernal Negative    11. Timothy Negative    12. Cocklebur Negative    13. Burweed Marshelder Negative    14. Ragweed, short Negative    15. Ragweed, Giant Negative    16. Plantain,  English Negative    17. Lamb's Quarters Negative    18. Sheep Sorrell Negative    19. Rough Pigweed Negative    20. Marsh Elder, Rough Negative    21. Mugwort, Common Negative    22. Ash mix 3+    23. Birch mix Negative    24. Beech American Negative    25. Box, Elder Negative  26. Cedar, red Negative    27. Cottonwood, Guinea-Bissau Negative    28. Elm mix Negative    29. Hickory Negative    30. Maple mix Negative    31. Oak, Guinea-Bissau  mix Negative    32. Pecan Pollen Negative    33. Pine mix Negative    34. Sycamore Eastern Negative    35. Walnut, Black Pollen Negative    36. Alternaria alternata Negative    37. Cladosporium Herbarum Negative    38. Aspergillus mix Negative    39. Penicillium mix Negative    40. Bipolaris sorokiniana (Helminthosporium) Negative    41. Drechslera spicifera (Curvularia) Negative    42. Mucor plumbeus Negative    43. Fusarium moniliforme Negative    44. Aureobasidium pullulans (pullulara) Negative    45. Rhizopus oryzae Negative    46. Botrytis cinera Negative    47. Epicoccum nigrum Negative    48. Phoma betae Negative    49. Candida Albicans Negative    50. Trichophyton mentagrophytes Negative    51. Mite, D Farinae  5,000 AU/ml Negative    52. Mite, D Pteronyssinus  5,000 AU/ml Negative    53. Cat Hair 10,000 BAU/ml Negative    54.  Dog Epithelia Negative    55. Mixed Feathers Negative    56. Horse Epithelia Negative    57. Cockroach, German Negative    58. Mouse Negative    59. Tobacco Leaf Negative             Food Perc - 12/03/20 1401       Test Information   Time Antigen Placed 1401    Allergen Manufacturer Waynette Buttery    Location Back    Number of allergen test 10      Food   1. Peanut Negative    2. Soybean food Negative    3. Wheat, whole Negative    4. Sesame Negative    5. Milk, cow Negative    6. Egg White, chicken Negative    7. Casein Negative    8. Shellfish mix Negative    9. Fish mix Negative    10. Cashew Negative             Intradermal - 12/03/20 1453     Time Antigen Placed 0250    Allergen Manufacturer Waynette Buttery    Location Arm    Number of Test 14    Intradermal Select    Control Negative    French Southern Territories Negative    Johnson Negative    7 Grass Negative    Ragweed mix Negative    Weed mix Negative    Mold 1 Negative    Mold 2 Negative    Mold 3 3+    Mold 4 Negative    Cat Negative    Dog Negative    Cockroach Negative    Mite  mix Negative             Allergy testing results were read and interpreted by myself, documented by clinical staff.         Malachi Bonds, MD [facility]

## 2020-12-03 NOTE — Patient Instructions (Addendum)
1. Prurigo nodularis - I would strongly consider starting Dupixent. - It was recently approved for treatment for prurigo nodularis. - We were going to get those outside records from the dermatologist.  2. Pruritus - Testing was only positive to ash tree pollen as well as outdoor mold mix. - We are going to get some labs to rule out weird causes of itching. - We we will call you in 1 to 2 weeks with the results of the testing. - Starting Dupixent would also help with controlling the itching. - In the meantime, start Allegra 1 tablet in the morning and 1 tablet at night.  3. Return in about 2 months (around 02/02/2021).    Please inform us of any Emergency Department visits, hospitalizations, or changes in symptoms. Call us before going to the ED for breathing or allergy symptoms since we might be able to fit you in for a sick visit. Feel free to contact us anytime with any questions, problems, or concerns.  It was a pleasure to meet you today!  Websites that have reliable patient information: 1. American Academy of Asthma, Allergy, and Immunology: www.aaaai.org 2. Food Allergy Research and Education (FARE): foodallergy.org 3. Mothers of Asthmatics: http://www.asthmacommunitynetwork.org 4. American College of Allergy, Asthma, and Immunology: www.acaai.org   COVID-19 Vaccine Information can be found at: [email or web address] For questions related to vaccine distribution or appointments, please email [email or web address] or call [phone].   We realize that you might be concerned about having an allergic reaction to the COVID19 vaccines. To help with that concern, WE ARE OFFERING THE COVID19 VACCINES IN OUR OFFICE! Ask the front desk for dates!     "Like" [place] on Facebook and Instagram for our latest updates!      A healthy democracy works best when [employer] participate! Make sure you are registered to vote! If you have moved  or changed any of your contact information, you will need to get this updated before voting!  In some cases, you MAY be able to register to vote online: [email or web address]    EARLY VOTING HAS STARTED! If you still need to register to vote, you can do this and cast a ballot at any of the early voting locations!           Airborne Adult Perc - 12/03/20 1401     Time Antigen Placed 1401    Allergen Manufacturer Waynette Buttery    Location Back    Number of Test 59    1. Control-Buffer 50% Glycerol Negative    2. Control-Histamine 1 mg/ml 2+    3. Albumin saline Negative    4. Bahia Negative    5. French Southern Territories Negative    6. Johnson Negative    7. Kentucky Blue Negative    8. Meadow Fescue Negative    9. Perennial Rye Negative    10. Sweet Vernal Negative    11. Timothy Negative    12. Cocklebur Negative    13. Burweed Marshelder Negative    14. Ragweed, short Negative    15. Ragweed, Giant Negative    16. Plantain,  English Negative    17. Lamb's Quarters Negative    18. Sheep Sorrell Negative    19. Rough Pigweed Negative    20. Marsh Elder, Rough Negative    21. Mugwort, Common Negative    22. Ash mix 3+    23. Birch mix Negative    24. Beech American Negative    25. Box, Elder Negative    26. Cyd Silence,  red Negative    27. Cottonwood, Guinea-Bissau Negative    28. Elm mix Negative    29. Hickory Negative    30. Maple mix Negative    31. Oak, Guinea-Bissau mix Negative    32. Pecan Pollen Negative    33. Pine mix Negative    34. Sycamore Eastern Negative    35. Walnut, Black Pollen Negative    36. Alternaria alternata Negative    37. Cladosporium Herbarum Negative    38. Aspergillus mix Negative    39. Penicillium mix Negative    40. Bipolaris sorokiniana (Helminthosporium) Negative    41. Drechslera spicifera (Curvularia) Negative    42. Mucor plumbeus Negative    43. Fusarium moniliforme Negative    44. Aureobasidium pullulans (pullulara) Negative     45. Rhizopus oryzae Negative    46. Botrytis cinera Negative    47. Epicoccum nigrum Negative    48. Phoma betae Negative    49. Candida Albicans Negative    50. Trichophyton mentagrophytes Negative    51. Mite, D Farinae  5,000 AU/ml Negative    52. Mite, D Pteronyssinus  5,000 AU/ml Negative    53. Cat Hair 10,000 BAU/ml Negative    54.  Dog Epithelia Negative    55. Mixed Feathers Negative    56. Horse Epithelia Negative    57. Cockroach, German Negative    58. Mouse Negative    59. Tobacco Leaf Negative             Food Perc - 12/03/20 1401       Test Information   Time Antigen Placed 1401    Allergen Manufacturer Waynette Buttery    Location Back    Number of allergen test 10      Food   1. Peanut Negative    2. Soybean food Negative    3. Wheat, whole Negative    4. Sesame Negative    5. Milk, cow Negative    6. Egg White, chicken Negative    7. Casein Negative    8. Shellfish mix Negative    9. Fish mix Negative    10. Cashew Negative             Intradermal - 12/03/20 1453     Time Antigen Placed 0250    Allergen Manufacturer Waynette Buttery    Location Arm    Number of Test 14    Intradermal Select    Control Negative    French Southern Territories Negative    Johnson Negative    7 Grass Negative    Ragweed mix Negative    Weed mix Negative    Mold 1 Negative    Mold 2 Negative    Mold 3 2+    Mold 4 Negative    Cat Negative    Dog Negative    Cockroach Negative    Mite mix Negative

## 2020-12-04 ENCOUNTER — Encounter: Payer: Self-pay | Admitting: Allergy & Immunology

## 2020-12-07 LAB — CMP14+EGFR
ALT: 20 IU/L (ref 0–32)
AST: 23 IU/L (ref 0–40)
Albumin/Globulin Ratio: 2.2 (ref 1.2–2.2)
Albumin: 5 g/dL — ABNORMAL HIGH (ref 3.8–4.8)
Alkaline Phosphatase: 94 IU/L (ref 44–121)
BUN/Creatinine Ratio: 20 (ref 12–28)
BUN: 15 mg/dL (ref 8–27)
Bilirubin Total: 0.4 mg/dL (ref 0.0–1.2)
CO2: 27 mmol/L (ref 20–29)
Calcium: 10.6 mg/dL — ABNORMAL HIGH (ref 8.7–10.3)
Chloride: 101 mmol/L (ref 96–106)
Creatinine, Ser: 0.75 mg/dL (ref 0.57–1.00)
Globulin, Total: 2.3 g/dL (ref 1.5–4.5)
Glucose: 82 mg/dL (ref 70–99)
Potassium: 4.1 mmol/L (ref 3.5–5.2)
Sodium: 140 mmol/L (ref 134–144)
Total Protein: 7.3 g/dL (ref 6.0–8.5)
eGFR: 86 mL/min/{1.73_m2} (ref >59)

## 2020-12-07 LAB — PROTEIN ELECTROPHORESIS, SERUM
A/G Ratio: 1.3 (ref 0.7–1.7)
Albumin ELP: 4.1 g/dL (ref 2.9–4.4)
Alpha 1: 0.2 g/dL (ref 0.0–0.4)
Alpha 2: 0.8 g/dL (ref 0.4–1.0)
Beta: 1.1 g/dL (ref 0.7–1.3)
Gamma Globulin: 1.1 g/dL (ref 0.4–1.8)
Globulin, Total: 3.2 g/dL (ref 2.2–3.9)

## 2020-12-07 LAB — ANTINUCLEAR ANTIBODIES, IFA: ANA Titer 1: NEGATIVE

## 2020-12-07 LAB — TRYPTASE: Tryptase: 6.2 ug/L (ref 2.2–13.2)

## 2020-12-07 LAB — C-REACTIVE PROTEIN: CRP: 1 mg/L (ref 0–10)

## 2020-12-07 LAB — SEDIMENTATION RATE: Sed Rate: 17 mm/hr (ref 0–40)

## 2020-12-08 ENCOUNTER — Telehealth: Payer: Self-pay | Admitting: *Deleted

## 2020-12-08 NOTE — Telephone Encounter (Signed)
-----   Message from Alfonse Spruce, MD sent at 12/04/2020 10:08 AM EDT ----- Ok I bolded all of her treatments in the note.  I did request records from [facility] dermatology, but those visits were apparently 15 years ago in with a dermatologist who has since passed away.  I am not sure we will be able to get those records.

## 2020-12-08 NOTE — Telephone Encounter (Signed)
L/m for patient to discuss patient assistance program for Dupixent for prurigo nodularis

## 2020-12-10 LAB — ALPHA-GAL PANEL
Allergen Lamb IgE: <0.1 kU/L
Beef IgE: <0.1 kU/L
IgE (Immunoglobulin E), Serum: 224 IU/mL (ref 6–495)
O215-IgE Alpha-Gal: <0.1 kU/L
Pork IgE: <0.1 kU/L

## 2020-12-14 NOTE — Telephone Encounter (Signed)
Spoke to patient and she has received the application for Dupixent My way that she will send back

## 2020-12-15 ENCOUNTER — Telehealth: Payer: Self-pay | Admitting: Allergy & Immunology

## 2020-12-15 NOTE — Telephone Encounter (Signed)
Called patient back regarding her lab results. She was able to fill out the forms and place them in the mail per talking to Tammy. She would like [place] to mail out information for Dupixent. I am mailing out a Dupixent booklet for skin to the address listed.

## 2020-12-15 NOTE — Telephone Encounter (Signed)
Virginia Turner calling back wanting lab results please.

## 2020-12-30 DIAGNOSIS — Z7984 Long term (current) use of oral hypoglycemic drugs: Secondary | ICD-10-CM | POA: Diagnosis not present

## 2020-12-30 DIAGNOSIS — I10 Essential (primary) hypertension: Secondary | ICD-10-CM | POA: Diagnosis not present

## 2020-12-30 DIAGNOSIS — E1100 Type 2 diabetes mellitus with hyperosmolarity without nonketotic hyperglycemic-hyperosmolar coma (NKHHC): Secondary | ICD-10-CM | POA: Diagnosis not present

## 2021-01-13 DIAGNOSIS — J349 Unspecified disorder of nose and nasal sinuses: Secondary | ICD-10-CM | POA: Diagnosis not present

## 2021-01-13 DIAGNOSIS — E1169 Type 2 diabetes mellitus with other specified complication: Secondary | ICD-10-CM | POA: Diagnosis not present

## 2021-01-13 DIAGNOSIS — I10 Essential (primary) hypertension: Secondary | ICD-10-CM | POA: Diagnosis not present

## 2021-01-13 DIAGNOSIS — J399 Disease of upper respiratory tract, unspecified: Secondary | ICD-10-CM | POA: Diagnosis not present

## 2021-01-13 DIAGNOSIS — G4700 Insomnia, unspecified: Secondary | ICD-10-CM | POA: Diagnosis not present

## 2021-02-02 ENCOUNTER — Ambulatory Visit: Payer: Medicare Other | Admitting: Allergy & Immunology

## 2021-02-04 ENCOUNTER — Ambulatory Visit: Payer: Medicare Other | Admitting: Allergy & Immunology

## 2021-02-08 ENCOUNTER — Ambulatory Visit
Admission: RE | Admit: 2021-02-08 | Discharge: 2021-02-08 | Disposition: A | Discharge status: Home / Self Care | Payer: Medicare Other | Source: Ambulatory Visit | Attending: Family Medicine | Admitting: Family Medicine

## 2021-02-08 ENCOUNTER — Other Ambulatory Visit: Payer: Self-pay | Admitting: Family Medicine

## 2021-02-08 DIAGNOSIS — M25611 Stiffness of right shoulder, not elsewhere classified: Secondary | ICD-10-CM

## 2021-02-08 DIAGNOSIS — M25511 Pain in right shoulder: Secondary | ICD-10-CM

## 2021-02-08 DIAGNOSIS — J399 Disease of upper respiratory tract, unspecified: Secondary | ICD-10-CM | POA: Diagnosis not present

## 2021-02-23 ENCOUNTER — Telehealth: Payer: Self-pay | Admitting: Allergy & Immunology

## 2021-02-23 NOTE — Telephone Encounter (Signed)
Patient called & canceled visit for 02/26/2020 with Dr. Dellis Anes. Patient states she has been sick for almost 2 months and has been taking medicine to help her. Patient's symptoms have disappeared while taking those medications. Patient is wondering if that caused her symptoms to stop. Her primary first prescribed her Norel AD and the 2nd time was Prednisone. Patient states she is pretty much done with the medications and her itching has came back. Patient is wondering if there is something in medications to make it all go away.   Best contact number: [phone]

## 2021-02-25 ENCOUNTER — Ambulatory Visit: Payer: Medicare Other | Admitting: Allergy & Immunology

## 2021-02-26 NOTE — Telephone Encounter (Signed)
I called and spoke with the patient and she stated that the time she didn't feel like there was much to talk about since she had not started taking [place]. I advised that you would like to speak with her about this to see what the next step may be, she stated she would like to avoid the Dupixent if possible because she does not like injections, she has been placed on the schedule this coming Tuesday for a televisit with you.

## 2021-02-26 NOTE — Telephone Encounter (Signed)
I guess I am a little confused.  She canceled her appointment but she still having ongoing issues?  Did she want to change to a televisit?   Salvatore Marvel, MD [facility]

## 2021-02-26 NOTE — Telephone Encounter (Signed)
I see. OK I look forward to chatting with her this Tuesday.  Malachi Bonds, MD [facility]

## 2021-03-02 ENCOUNTER — Ambulatory Visit (INDEPENDENT_AMBULATORY_CARE_PROVIDER_SITE_OTHER): Payer: Medicare Other | Admitting: Allergy & Immunology

## 2021-03-02 ENCOUNTER — Encounter: Payer: Self-pay | Admitting: Allergy & Immunology

## 2021-03-02 ENCOUNTER — Other Ambulatory Visit: Payer: Self-pay

## 2021-03-02 VITALS — Ht 64.0 in | Wt 168.0 lb

## 2021-03-02 DIAGNOSIS — L299 Pruritus, unspecified: Secondary | ICD-10-CM | POA: Diagnosis not present

## 2021-03-02 DIAGNOSIS — J302 Other seasonal allergic rhinitis: Secondary | ICD-10-CM

## 2021-03-02 DIAGNOSIS — L281 Prurigo nodularis: Secondary | ICD-10-CM

## 2021-03-02 MED ORDER — CHLORPHENIRAMINE MALEATE 4 MG PO TABS: 4.0000 mg | ORAL_TABLET | Freq: Two times a day (BID) | ORAL | 5 refills | Status: AC

## 2021-03-02 NOTE — Patient Instructions (Signed)
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   2. Pruritus - We are going to start chlorpheniramine 4 mg twice daily. - The other ingredients in Norel are phenylephrine and dextromethorphan, which I do not think would have done anything for your itching. - Call us with an update.   3.  Follow-up in 6 months or earlier if needed.   Please inform us of any Emergency Department visits, hospitalizations, or changes in symptoms. Call us before going to the ED for breathing or allergy symptoms since we might be able to fit you in for a sick visit. Feel free to contact us anytime with any questions, problems, or concerns.  It was a pleasure to see you again today!  Websites that have reliable patient information: 1. American Academy of Asthma, Allergy, and Immunology: www.aaaai.org 2. Food Allergy Research and Education (FARE): foodallergy.org 3. Mothers of Asthmatics: http://www.asthmacommunitynetwork.org 4. American College of Allergy, Asthma, and Immunology: www.acaai.org   COVID-19 Vaccine Information can be found at: [email or web address] For questions related to vaccine distribution or appointments, please email [email or web address] or call [phone].   We realize that you might be concerned about having an allergic reaction to the COVID19 vaccines. To help with that concern, WE ARE OFFERING THE COVID19 VACCINES IN OUR OFFICE! Ask the front desk for dates!     Like [place] on [employer] and Instagram for our latest updates!      A healthy democracy works best when [employer] participate! Make sure you are registered to vote! If you have moved or changed any of your contact information, you will need to get this updated before voting!  In some cases, you MAY be able to register to vote online: [email or web address]    EARLY VOTING  HAS STARTED! If you still need to register to vote, you can do this and cast a ballot at any of the early voting locations!           Airborne Adult Perc - 12/03/20 1401     Time Antigen Placed 1401    Allergen Manufacturer Waynette Buttery    Location Back    Number of Test 59    1. Control-Buffer 50% Glycerol Negative    2. Control-Histamine 1 mg/ml 2+    3. Albumin saline Negative    4. Bahia Negative    5. French Southern Territories Negative    6. Johnson Negative    7. Kentucky Blue Negative    8. Meadow Fescue Negative    9. Perennial Rye Negative    10. Sweet Vernal Negative    11. Timothy Negative    12. Cocklebur Negative    13. Burweed Marshelder Negative    14. Ragweed, short Negative    15. Ragweed, Giant Negative    16. Plantain,  English Negative    17. Lamb's Quarters Negative    18. Sheep Sorrell Negative    19. Rough Pigweed Negative    20. Marsh Elder, Rough Negative    21. Mugwort, Common Negative    22. Ash mix 3+    23. Birch mix Negative    24. Beech American Negative    25. Box, Elder Negative    26. Cedar, red Negative    27. Cottonwood, Guinea-Bissau Negative    28. Elm mix Negative    29. Hickory Negative    30. Maple mix Negative    31. Oak, Guinea-Bissau mix Negative    32. Pecan  Pollen Negative    33. Pine mix Negative    34. Sycamore Eastern Negative    35. Walnut, Black Pollen Negative    36. Alternaria alternata Negative    37. Cladosporium Herbarum Negative    38. Aspergillus mix Negative    39. Penicillium mix Negative    40. Bipolaris sorokiniana (Helminthosporium) Negative    41. Drechslera spicifera (Curvularia) Negative    42. Mucor plumbeus Negative    43. Fusarium moniliforme Negative    44. Aureobasidium pullulans (pullulara) Negative    45. Rhizopus oryzae Negative    46. Botrytis cinera Negative    47. Epicoccum nigrum Negative    48. Phoma betae Negative    49. Candida Albicans Negative    50. Trichophyton mentagrophytes Negative    51. Mite, D  Farinae  5,000 AU/ml Negative    52. Mite, D Pteronyssinus  5,000 AU/ml Negative    53. Cat Hair 10,000 BAU/ml Negative    54.  Dog Epithelia Negative    55. Mixed Feathers Negative    56. Horse Epithelia Negative    57. Cockroach, German Negative    58. Mouse Negative    59. Tobacco Leaf Negative             Food Perc - 12/03/20 1401       Test Information   Time Antigen Placed 1401    Allergen Manufacturer Waynette Buttery    Location Back    Number of allergen test 10      Food   1. Peanut Negative    2. Soybean food Negative    3. Wheat, whole Negative    4. Sesame Negative    5. Milk, cow Negative    6. Egg White, chicken Negative    7. Casein Negative    8. Shellfish mix Negative    9. Fish mix Negative    10. Cashew Negative             Intradermal - 12/03/20 1453     Time Antigen Placed 0250    Allergen Manufacturer Waynette Buttery    Location Arm    Number of Test 14    Intradermal Select    Control Negative    French Southern Territories Negative    Johnson Negative    7 Grass Negative    Ragweed mix Negative    Weed mix Negative    Mold 1 Negative    Mold 2 Negative    Mold 3 2+    Mold 4 Negative    Cat Negative    Dog Negative    Cockroach Negative    Mite mix Negative

## 2021-03-02 NOTE — Progress Notes (Signed)
RE: Virginia Turner MRN: [id number] DOB: 1950/04/21 Date of Telemedicine Visit: 03/02/2021  Referring provider: Lucianne Lei, MD Primary care provider: Lucianne Lei, MD  Chief Complaint: Other (Has been sick for a month in a half was on two medication - norel ad (helped with itching) and prednisone for 3 weeks )   Telemedicine Follow Up Visit via Telephone: I connected with Virginia Turner for a follow up on 03/02/21 by telephone and verified that I am speaking with the correct person using two identifiers.   I discussed the limitations, risks, security and privacy concerns of performing an evaluation and management service by telephone and the availability of in person appointments. I also discussed with the patient that there may be a patient responsible charge related to this service. The patient expressed understanding and agreed to proceed.  Patient is at home.  Provider is at the office.  Visit start time: 11:31 AM Visit end time: 11:50 AM Insurance consent/check in by: [facility] consent and medical assistant/nurse: Diandra  History of Present Illness:  She is a 71 y.o. 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 Her previous allergy office visit was in November 2022 with myself.  She was last seen in November 2022.  At that time, we discussed starting [place] for management of her prurigo nodularis.  She had testing that was positive to grass pollen as well as an outdoor mold mix.  We got some labs to rule out weird causes of itching.  We started Allegra 1 tablet twice daily.  All of the labs he got were perfectly normal.   Since the last visit, she is now itching again. She was doing Norel AD and she thinks that this helped with the itching. She was on prednisone smaller amounts as a taper. She is now itching again. It completely when she was on the Norel D. Allegra did absolutely nothing. She has tried a number of  antihistamines without much success. The Norel was the only that that seems to have helped at all. She is wondering whether we can try using the antihistamine with the Norel to help with the itching. She is not interested in initiating Dupixent at this point in time.   Otherwise, there have been no changes to her past medical history, surgical history, family history, or social history.  Assessment and Plan:  Virginia Turner is a 71 y.o. 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  2. Pruritus - We are going to start chlorpheniramine 4 mg twice daily. - The other ingredients in Norel are phenylephrine and dextromethorphan, which I do not think would have done anything for your itching. - Call us with an update.   3.  Follow-up in 6 months or earlier if needed.   Diagnostics: None.  Medication List:  Current Outpatient Medications  Medication Sig Dispense Refill   icosapent Ethyl (VASCEPA) 1 g capsule Take 1 capsule by mouth daily.     rosuvastatin (CRESTOR) 40 MG tablet Take 40 mg by mouth daily.     SitaGLIPtin-MetFORMIN HCl 50-500 MG TB24 Take 1 tablet by mouth daily.     valsartan-hydrochlorothiazide (DIOVAN-HCT) 160-25 MG tablet Take 1 tablet by mouth daily.     NOREL AD 4-10-325 MG  TABS Take 1 tablet by mouth 4 (four) times daily.     No current facility-administered medications for this visit.   Allergies: Not on File I reviewed her past medical history, social history, family history, and environmental history and no significant changes have been reported from previous visits.  Review of Systems  Constitutional:  Negative for activity change and appetite change.  HENT:  Negative for congestion, postnasal drip, rhinorrhea, sinus pressure and sore throat.   Eyes:   Negative for pain, discharge, redness and itching.  Respiratory:  Negative for shortness of breath, wheezing and stridor.   Gastrointestinal:  Negative for diarrhea, nausea and vomiting.  Musculoskeletal:  Negative for arthralgias, joint swelling and myalgias.  Skin:  Negative for rash.       Positive for itching.  Allergic/Immunologic: Negative for environmental allergies and food allergies.   Objective:  Physical exam not obtained as encounter was done via telephone.   Previous notes and tests were reviewed.  I discussed the assessment and treatment plan with the patient. The patient was provided an opportunity to ask questions and all were answered. The patient agreed with the plan and demonstrated an understanding of the instructions.   The patient was advised to call back or seek an in-person evaluation if the symptoms worsen or if the condition fails to improve as anticipated.  I provided 29 minutes of non-face-to-face time during this encounter.  It was my pleasure to participate in Virginia Turner's care today. Please feel free to contact me with any questions or concerns.   Sincerely,  Valentina Shaggy, MD

## 2021-03-04 ENCOUNTER — Encounter: Payer: Self-pay | Admitting: Allergy & Immunology

## 2021-03-15 DIAGNOSIS — E1169 Type 2 diabetes mellitus with other specified complication: Secondary | ICD-10-CM | POA: Diagnosis not present

## 2021-03-15 DIAGNOSIS — I10 Essential (primary) hypertension: Secondary | ICD-10-CM | POA: Diagnosis not present

## 2021-03-15 DIAGNOSIS — E785 Hyperlipidemia, unspecified: Secondary | ICD-10-CM | POA: Diagnosis not present

## 2021-03-16 DIAGNOSIS — J399 Disease of upper respiratory tract, unspecified: Secondary | ICD-10-CM | POA: Diagnosis not present

## 2021-03-16 DIAGNOSIS — E1169 Type 2 diabetes mellitus with other specified complication: Secondary | ICD-10-CM | POA: Diagnosis not present

## 2021-03-16 DIAGNOSIS — E782 Mixed hyperlipidemia: Secondary | ICD-10-CM | POA: Diagnosis not present

## 2021-03-16 DIAGNOSIS — I10 Essential (primary) hypertension: Secondary | ICD-10-CM | POA: Diagnosis not present

## 2021-04-05 DIAGNOSIS — E782 Mixed hyperlipidemia: Secondary | ICD-10-CM | POA: Diagnosis not present

## 2021-04-05 DIAGNOSIS — I10 Essential (primary) hypertension: Secondary | ICD-10-CM | POA: Diagnosis not present

## 2021-04-05 DIAGNOSIS — E1169 Type 2 diabetes mellitus with other specified complication: Secondary | ICD-10-CM | POA: Diagnosis not present

## 2021-04-06 DIAGNOSIS — E1169 Type 2 diabetes mellitus with other specified complication: Secondary | ICD-10-CM | POA: Diagnosis not present

## 2021-04-06 DIAGNOSIS — I10 Essential (primary) hypertension: Secondary | ICD-10-CM | POA: Diagnosis not present

## 2021-04-12 DIAGNOSIS — Z1231 Encounter for screening mammogram for malignant neoplasm of breast: Secondary | ICD-10-CM | POA: Diagnosis not present

## 2021-07-05 DIAGNOSIS — I10 Essential (primary) hypertension: Secondary | ICD-10-CM | POA: Diagnosis not present

## 2021-07-05 DIAGNOSIS — E782 Mixed hyperlipidemia: Secondary | ICD-10-CM | POA: Diagnosis not present

## 2021-07-05 DIAGNOSIS — E1169 Type 2 diabetes mellitus with other specified complication: Secondary | ICD-10-CM | POA: Diagnosis not present

## 2021-07-06 DIAGNOSIS — I10 Essential (primary) hypertension: Secondary | ICD-10-CM | POA: Diagnosis not present

## 2021-07-06 DIAGNOSIS — E1169 Type 2 diabetes mellitus with other specified complication: Secondary | ICD-10-CM | POA: Diagnosis not present

## 2021-07-06 DIAGNOSIS — R7309 Other abnormal glucose: Secondary | ICD-10-CM | POA: Diagnosis not present

## 2021-07-06 DIAGNOSIS — E782 Mixed hyperlipidemia: Secondary | ICD-10-CM | POA: Diagnosis not present

## 2021-08-25 ENCOUNTER — Telehealth: Payer: Self-pay | Admitting: Allergy & Immunology

## 2021-08-25 NOTE — Telephone Encounter (Signed)
Called patient to discuss start of therapy. I thought she was already on therapy?  I advised patient to store meds in fridge and appt made in [place] for 7/27 to start therapy 600mg  loading then 300mg  every 14 days

## 2021-08-25 NOTE — Telephone Encounter (Signed)
Pt called to let us know that she has recieved her Dupixent in the mail and wated to know the next steps/ instructions.

## 2021-08-26 ENCOUNTER — Ambulatory Visit: Payer: Medicare Other

## 2021-08-26 DIAGNOSIS — L281 Prurigo nodularis: Secondary | ICD-10-CM

## 2021-08-26 NOTE — Progress Notes (Signed)
Immunotherapy   Patient Details  Name: Virginia Turner MRN: [id number] Date of Birth: 1950/07/25  08/26/2021  Virginia Turner here to learn how to self administer her Dupixent at home for her prurigo nodularis. Patient was shown how to self administer and patient administered a 600 mg loading dose and demonstrated correct administration and will continue at home every 14 days. Patient waited 30 minutes with no problems.  NDC # [id number] Lot # [id number] Exp 10/31/2023.   Dub Mikes 08/26/2021, 11:04 AM

## 2021-11-11 ENCOUNTER — Encounter: Payer: Self-pay | Admitting: Allergy & Immunology

## 2021-11-11 ENCOUNTER — Other Ambulatory Visit: Payer: Self-pay

## 2021-11-11 ENCOUNTER — Ambulatory Visit (INDEPENDENT_AMBULATORY_CARE_PROVIDER_SITE_OTHER): Payer: Medicare Other | Admitting: Allergy & Immunology

## 2021-11-11 VITALS — BP 118/80 | HR 76 | Temp 98.3°F | Resp 20 | Ht 64.0 in | Wt 150.4 lb

## 2021-11-11 DIAGNOSIS — L281 Prurigo nodularis: Secondary | ICD-10-CM | POA: Diagnosis not present

## 2021-11-11 DIAGNOSIS — L299 Pruritus, unspecified: Secondary | ICD-10-CM | POA: Diagnosis not present

## 2021-11-11 DIAGNOSIS — J302 Other seasonal allergic rhinitis: Secondary | ICD-10-CM | POA: Diagnosis not present

## 2021-11-11 NOTE — Progress Notes (Signed)
FOLLOW UP  Date of Service/Encounter:  11/11/21   Assessment:   Prurigo nodularis - improved with Dupixent   Pruritus - mostly of face without obvious lesions   Seasonal allergic rhinitis (trees, outdoor molds)  Plan/Recommendations:   1. Prurigo nodularis with pruritus - Continue Dupixent for long term control.  2. Rhinorrhea - Continue with nasal saline.   3. Return in about 6 months (around 05/13/2022).    Subjective:   Virginia Turner is a 71 y.o. female presenting today for follow up of  Chief Complaint  Patient presents with   Other    Here for a dupixent refill. Has been helping with the nodules and has not been scratching as much.     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  Virginia Turner is a 71 y.o. female presenting for a follow up visit. She was seen in January 2023. At that time, we recommended considering with Dupixent for long term control. We started Chlortab to help with her rhinorrhea and itch control.  Since the last visit, she has done fairly well. Her husband recently passsed away in January 2022. She called in December to get the [place] back on board. Her husband had dementia and so the death was not entirely surprising. But it has been a hard transition.   She did finally start the [place]. She has been giving it to herself at home. She is not having any problems with this at all. She does report that it has helped with her pruritis. Her lesions on her arms have smoothed out but the permanent changes are still there.   She also had another rash on her lower extremities that has cleared.   Otherwise, there have been no changes to her past medical history, surgical history, family history, or social history.    Review of Systems  Constitutional: Negative.  Negative for  chills, fever, malaise/fatigue and weight loss.  HENT: Negative.  Negative for congestion, ear discharge, ear pain and sinus pain.   Eyes:  Negative for pain, discharge and redness.  Respiratory:  Negative for cough, sputum production, shortness of breath, wheezing and stridor.   Cardiovascular: Negative.  Negative for chest pain and palpitations.  Gastrointestinal:  Negative for abdominal pain, constipation, diarrhea, heartburn, nausea and vomiting.  Skin: Negative.  Negative for itching and rash.  Neurological:  Negative for dizziness and headaches.  Endo/Heme/Allergies:  Negative for environmental allergies. Does not bruise/bleed easily.       Objective:   Blood pressure 118/80, pulse 76, temperature 98.3 F (36.8 C), resp. rate 20, height 5\' 4"  (1.626 m), weight 150 lb 6.4 oz (68.2 kg), SpO2 98 %. Body mass index is 25.82 kg/m.    Physical Exam Vitals reviewed.  Constitutional:      Appearance: She is well-developed.  HENT:     Head: Normocephalic and atraumatic.     Right Ear: Tympanic membrane, ear canal and external ear normal. No drainage, swelling or tenderness. Tympanic membrane is not injected, scarred, erythematous, retracted or bulging.     Left Ear: Tympanic membrane, ear canal and external ear normal. No drainage, swelling or tenderness. Tympanic membrane is not injected, scarred, erythematous, retracted or bulging.     Nose: No nasal deformity, septal deviation, mucosal edema or rhinorrhea.     Right Turbinates: Enlarged and  swollen.     Left Turbinates: Enlarged and swollen.     Right Sinus: No maxillary sinus tenderness or frontal sinus tenderness.     Left Sinus: No maxillary sinus tenderness or frontal sinus tenderness.     Mouth/Throat:     Mouth: Mucous membranes are not pale and not dry.     Pharynx: Uvula midline.  Eyes:     General:        Right eye: No discharge.        Left eye: No discharge.     Conjunctiva/sclera: Conjunctivae normal.     Right  eye: Right conjunctiva is not injected. No chemosis.    Left eye: Left conjunctiva is not injected. No chemosis.    Pupils: Pupils are equal, round, and reactive to light.  Cardiovascular:     Rate and Rhythm: Normal rate and regular rhythm.     Heart sounds: Normal heart sounds.  Pulmonary:     Effort: Pulmonary effort is normal. No tachypnea, accessory muscle usage or respiratory distress.     Breath sounds: Normal breath sounds. No wheezing, rhonchi or rales.     Comments: Good air movement throughout.  Chest:     Chest wall: No tenderness.  Abdominal:     Tenderness: There is no abdominal tenderness. There is no guarding or rebound.  Lymphadenopathy:     Head:     Right side of head: No submandibular, tonsillar or occipital adenopathy.     Left side of head: No submandibular, tonsillar or occipital adenopathy.     Cervical: No cervical adenopathy.  Skin:    General: Skin is warm.     Capillary Refill: Capillary refill takes less than 2 seconds.     Coloration: Skin is not pale.     Findings: Rash present. No abrasion, erythema or petechiae. Rash is not papular, urticarial or vesicular.     Comments: The previously areas of prurigo nodularis on her bilateral arms have now smoothed over, although residual hyperpigmentation does remain.  Neurological:     Mental Status: She is alert.  Psychiatric:        Behavior: Behavior is cooperative.      Diagnostic studies: none      Malachi Bonds, MD  [facility]

## 2021-11-11 NOTE — Patient Instructions (Addendum)
1. Prurigo nodularis with pruritus - Continue Dupixent for long term control.  2. Rhinorrhea - Continue with nasal saline.   3. Return in about 6 months (around 05/13/2022).    Please inform us of any Emergency Department visits, hospitalizations, or changes in symptoms. Call us before going to the ED for breathing or allergy symptoms since we might be able to fit you in for a sick visit. Feel free to contact us anytime with any questions, problems, or concerns.  It was a pleasure to see you again today!  Websites that have reliable patient information: 1. American Academy of Asthma, Allergy, and Immunology: www.aaaai.org 2. Food Allergy Research and Education (FARE): foodallergy.org 3. Mothers of Asthmatics: http://www.asthmacommunitynetwork.org 4. American College of Allergy, Asthma, and Immunology: www.acaai.org   COVID-19 Vaccine Information can be found at: [email or web address] For questions related to vaccine distribution or appointments, please email [email or web address] or call [phone].   We realize that you might be concerned about having an allergic reaction to the COVID19 vaccines. To help with that concern, WE ARE OFFERING THE COVID19 VACCINES IN OUR OFFICE! Ask the front desk for dates!     "Like" [place] on [employer] and Instagram for our latest updates!      A healthy democracy works best when [employer] participate! Make sure you are registered to vote! If you have moved or changed any of your contact information, you will need to get this updated before voting!  In some cases, you MAY be able to register to vote online: [email or web address]

## 2021-11-15 DIAGNOSIS — E782 Mixed hyperlipidemia: Secondary | ICD-10-CM | POA: Diagnosis not present

## 2021-11-15 DIAGNOSIS — R7309 Other abnormal glucose: Secondary | ICD-10-CM | POA: Diagnosis not present

## 2021-11-15 DIAGNOSIS — I10 Essential (primary) hypertension: Secondary | ICD-10-CM | POA: Diagnosis not present

## 2021-11-15 DIAGNOSIS — E1169 Type 2 diabetes mellitus with other specified complication: Secondary | ICD-10-CM | POA: Diagnosis not present

## 2021-11-16 DIAGNOSIS — E785 Hyperlipidemia, unspecified: Secondary | ICD-10-CM | POA: Diagnosis not present

## 2021-11-16 DIAGNOSIS — E1169 Type 2 diabetes mellitus with other specified complication: Secondary | ICD-10-CM | POA: Diagnosis not present

## 2022-03-07 DIAGNOSIS — E1169 Type 2 diabetes mellitus with other specified complication: Secondary | ICD-10-CM | POA: Diagnosis not present

## 2022-03-07 DIAGNOSIS — E785 Hyperlipidemia, unspecified: Secondary | ICD-10-CM | POA: Diagnosis not present

## 2022-03-07 DIAGNOSIS — I10 Essential (primary) hypertension: Secondary | ICD-10-CM | POA: Diagnosis not present

## 2022-03-07 DIAGNOSIS — R7309 Other abnormal glucose: Secondary | ICD-10-CM | POA: Diagnosis not present

## 2022-03-08 DIAGNOSIS — E782 Mixed hyperlipidemia: Secondary | ICD-10-CM | POA: Diagnosis not present

## 2022-03-08 DIAGNOSIS — I10 Essential (primary) hypertension: Secondary | ICD-10-CM | POA: Diagnosis not present

## 2022-03-08 DIAGNOSIS — Z0000 Encounter for general adult medical examination without abnormal findings: Secondary | ICD-10-CM | POA: Diagnosis not present

## 2022-03-08 DIAGNOSIS — L237 Allergic contact dermatitis due to plants, except food: Secondary | ICD-10-CM | POA: Diagnosis not present

## 2022-03-08 DIAGNOSIS — R7309 Other abnormal glucose: Secondary | ICD-10-CM | POA: Diagnosis not present

## 2022-04-18 DIAGNOSIS — Z1231 Encounter for screening mammogram for malignant neoplasm of breast: Secondary | ICD-10-CM | POA: Diagnosis not present

## 2022-04-22 DIAGNOSIS — I10 Essential (primary) hypertension: Secondary | ICD-10-CM | POA: Diagnosis not present

## 2022-04-22 DIAGNOSIS — E785 Hyperlipidemia, unspecified: Secondary | ICD-10-CM | POA: Diagnosis not present

## 2022-04-22 DIAGNOSIS — E782 Mixed hyperlipidemia: Secondary | ICD-10-CM | POA: Diagnosis not present

## 2022-04-22 DIAGNOSIS — E1169 Type 2 diabetes mellitus with other specified complication: Secondary | ICD-10-CM | POA: Diagnosis not present

## 2022-05-03 DIAGNOSIS — Z1211 Encounter for screening for malignant neoplasm of colon: Secondary | ICD-10-CM | POA: Diagnosis not present

## 2022-05-03 DIAGNOSIS — Z1212 Encounter for screening for malignant neoplasm of rectum: Secondary | ICD-10-CM | POA: Diagnosis not present

## 2022-05-09 DIAGNOSIS — L259 Unspecified contact dermatitis, unspecified cause: Secondary | ICD-10-CM | POA: Diagnosis not present

## 2022-05-09 DIAGNOSIS — L720 Epidermal cyst: Secondary | ICD-10-CM | POA: Diagnosis not present

## 2022-05-09 LAB — COLOGUARD: COLOGUARD: NEGATIVE

## 2022-05-17 ENCOUNTER — Ambulatory Visit: Payer: Medicare Other | Admitting: Allergy & Immunology

## 2022-06-06 DIAGNOSIS — L259 Unspecified contact dermatitis, unspecified cause: Secondary | ICD-10-CM | POA: Diagnosis not present

## 2022-06-09 DIAGNOSIS — L258 Unspecified contact dermatitis due to other agents: Secondary | ICD-10-CM | POA: Diagnosis not present

## 2022-10-04 DIAGNOSIS — E782 Mixed hyperlipidemia: Secondary | ICD-10-CM | POA: Diagnosis not present

## 2022-10-04 DIAGNOSIS — E559 Vitamin D deficiency, unspecified: Secondary | ICD-10-CM | POA: Diagnosis not present

## 2022-10-04 DIAGNOSIS — I10 Essential (primary) hypertension: Secondary | ICD-10-CM | POA: Diagnosis not present

## 2022-10-04 DIAGNOSIS — E785 Hyperlipidemia, unspecified: Secondary | ICD-10-CM | POA: Diagnosis not present

## 2022-10-04 DIAGNOSIS — E1169 Type 2 diabetes mellitus with other specified complication: Secondary | ICD-10-CM | POA: Diagnosis not present

## 2022-10-05 DIAGNOSIS — I10 Essential (primary) hypertension: Secondary | ICD-10-CM | POA: Diagnosis not present

## 2022-10-05 DIAGNOSIS — E1169 Type 2 diabetes mellitus with other specified complication: Secondary | ICD-10-CM | POA: Diagnosis not present

## 2022-10-05 DIAGNOSIS — E785 Hyperlipidemia, unspecified: Secondary | ICD-10-CM | POA: Diagnosis not present

## 2022-10-05 DIAGNOSIS — L720 Epidermal cyst: Secondary | ICD-10-CM | POA: Diagnosis not present

## 2022-10-07 DIAGNOSIS — L723 Sebaceous cyst: Secondary | ICD-10-CM | POA: Diagnosis not present

## 2022-10-07 DIAGNOSIS — L089 Local infection of the skin and subcutaneous tissue, unspecified: Secondary | ICD-10-CM | POA: Diagnosis not present

## 2022-11-23 DIAGNOSIS — Z961 Presence of intraocular lens: Secondary | ICD-10-CM | POA: Diagnosis not present

## 2022-11-23 DIAGNOSIS — H524 Presbyopia: Secondary | ICD-10-CM | POA: Diagnosis not present

## 2022-11-23 DIAGNOSIS — H35033 Hypertensive retinopathy, bilateral: Secondary | ICD-10-CM | POA: Diagnosis not present

## 2022-11-23 DIAGNOSIS — R7303 Prediabetes: Secondary | ICD-10-CM | POA: Diagnosis not present

## 2022-11-23 DIAGNOSIS — H43813 Vitreous degeneration, bilateral: Secondary | ICD-10-CM | POA: Diagnosis not present

## 2022-11-23 DIAGNOSIS — I10 Essential (primary) hypertension: Secondary | ICD-10-CM | POA: Diagnosis not present

## 2022-11-23 DIAGNOSIS — H4423 Degenerative myopia, bilateral: Secondary | ICD-10-CM | POA: Diagnosis not present

## 2023-01-04 DIAGNOSIS — M25511 Pain in right shoulder: Secondary | ICD-10-CM | POA: Diagnosis not present

## 2023-01-05 DIAGNOSIS — M7581 Other shoulder lesions, right shoulder: Secondary | ICD-10-CM | POA: Diagnosis not present

## 2023-01-17 DIAGNOSIS — R7303 Prediabetes: Secondary | ICD-10-CM | POA: Diagnosis not present

## 2023-01-17 DIAGNOSIS — H35033 Hypertensive retinopathy, bilateral: Secondary | ICD-10-CM | POA: Diagnosis not present

## 2023-01-17 DIAGNOSIS — H524 Presbyopia: Secondary | ICD-10-CM | POA: Diagnosis not present

## 2023-01-17 DIAGNOSIS — Z961 Presence of intraocular lens: Secondary | ICD-10-CM | POA: Diagnosis not present

## 2023-01-17 DIAGNOSIS — H43813 Vitreous degeneration, bilateral: Secondary | ICD-10-CM | POA: Diagnosis not present

## 2023-01-17 DIAGNOSIS — I10 Essential (primary) hypertension: Secondary | ICD-10-CM | POA: Diagnosis not present

## 2023-01-17 DIAGNOSIS — H4423 Degenerative myopia, bilateral: Secondary | ICD-10-CM | POA: Diagnosis not present

## 2023-01-19 DIAGNOSIS — H43813 Vitreous degeneration, bilateral: Secondary | ICD-10-CM | POA: Diagnosis not present

## 2023-01-19 DIAGNOSIS — H353211 Exudative age-related macular degeneration, right eye, with active choroidal neovascularization: Secondary | ICD-10-CM | POA: Diagnosis not present

## 2023-01-19 DIAGNOSIS — H353122 Nonexudative age-related macular degeneration, left eye, intermediate dry stage: Secondary | ICD-10-CM | POA: Diagnosis not present

## 2023-01-19 DIAGNOSIS — H15833 Staphyloma posticum, bilateral: Secondary | ICD-10-CM | POA: Diagnosis not present

## 2023-01-19 DIAGNOSIS — H31092 Other chorioretinal scars, left eye: Secondary | ICD-10-CM | POA: Diagnosis not present

## 2023-02-08 ENCOUNTER — Ambulatory Visit: Payer: Medicare Other | Admitting: Dermatology

## 2023-02-08 ENCOUNTER — Encounter: Payer: Self-pay | Admitting: Dermatology

## 2023-02-08 VITALS — BP 139/87

## 2023-02-08 DIAGNOSIS — L209 Atopic dermatitis, unspecified: Secondary | ICD-10-CM | POA: Diagnosis not present

## 2023-02-08 MED ORDER — ZORYVE 0.15 % EX CREA: 1.0000 | TOPICAL_CREAM | Freq: Every day | CUTANEOUS | 1 refills | Status: AC

## 2023-02-08 NOTE — Progress Notes (Signed)
   New Patient Visit   Subjective  Virginia Turner is a 73 y.o. female who presents for the following: New Pt   Patient states she has itchy skin mainly located on her forehead but it is also the scattered on the face that she would like to have examined. Patient reports the areas have been there for 5 or 6 years. She reports the areas are bothersome.Patient rates irritation (itching)10 out of 10. She states that the areas have not spread. Patient reports she has previously been treated for these areas. She was Rx'd Dupixent that she used for 6mo but D/C as it did not control the symptoms. She was also Rx'd a steroid that did not help. Patient denies Hx of bx. Patient denies family history of skin cancer(s).   The following portions of the chart were reviewed this encounter and updated as appropriate: medications, allergies, medical history  Review of Systems:  No other skin or systemic complaints except as noted in HPI or Assessment and Plan.  Objective  Well appearing patient in no apparent distress; mood and affect are within normal limits.   A focused examination was performed of the following areas: forehead    Relevant exam findings are noted in the Assessment and Plan.         Assessment & Plan   ATOPIC DERMATITIS Exam: Scaly pink papules coalescing to plaques located at the face 10% BSA  flared  Atopic dermatitis (eczema) is a chronic, relapsing, pruritic condition that can significantly affect quality of life. It is often associated with allergic rhinitis and/or asthma and can require treatment with topical medications, phototherapy, or in severe cases biologic injectable medication (Dupixent; Adbry) or Oral JAK inhibitors.  Treatment Plan: -Rx Zorvye cream 0.15% - use daily on affected areas. Sent to [facility]. Sample provided today. - Recommended using VaniCream lotion and soap bar that is free of preservatives. Pics included in AVS. -  If the treatment is  ineffective, consider Nemluvio  (a new biologic for itch) as the next treatment option.    Avoid picking/rubbing/scratching   ATOPIC DERMATITIS, UNSPECIFIED TYPE   Related Medications Roflumilast  (ZORYVE ) 0.15 % CREA Apply 1 Application topically daily. Use pea sized amount on affected areas.  Return in about 2 months (around 04/08/2023) for atopic dermatitis.    Documentation: I have reviewed the above documentation for accuracy and completeness, and I agree with the above.   I, Shirron Maranda, CMA, am acting as scribe for [employer], DO.   Delon Lenis, DO

## 2023-02-08 NOTE — Patient Instructions (Addendum)
 Hello Krystina,  Thank you for visiting my office today. Your dedication to addressing your skin concerns is greatly appreciated, and I am committed to assisting you in improving your skin health. Here is a summary of the key instructions from today's consultation:  Medication: Start applying Zorev Cream 0.15% daily.   Application: Use a pea-sized amount for the entire face, covering the forehead, eyelids, and cheeks.  Skincare Products:   Moisturizer: Switch your current moisturizer to Vanicream.   Cleansing: Use Dove Unscented or Vanicream bar soap for washing your face.  Follow-Up: We have scheduled a return visit in 2 months to evaluate your progress and explore additional treatment options if needed.  Pharmacy Instructions: Your prescription for Zoryve  Cream has been forwarded to [employer]. They will reach out to you to confirm insurance details and discuss rebates for a reduced copay.  Additional Advice: To avoid irritation, refrain from rubbing your face excessively.  Please adhere to these instructions carefully, and should you have any questions or concerns, do not hesitate to contact our office. We are here to support you throughout your journey to better skin health.  Warm regards,  Dr. Delon Lenis [facility]         Important Information  Due to recent changes in healthcare laws, you may see results of your pathology and/or laboratory studies on MyChart before the doctors have had a chance to review them. We understand that in some cases there may be results that are confusing or concerning to you. Please understand that not all results are received at the same time and often the doctors may need to interpret multiple results in order to provide you with the best plan of care or course of treatment. Therefore, we ask that you please give us  2 business days to thoroughly review all your results before contacting the office for clarification. Should we see a critical  lab result, you will be contacted sooner.   If You Need Anything After Your Visit  If you have any questions or concerns for your doctor, please call our main line at [phone] If no one answers, please leave a voicemail as directed and we will return your call as soon as possible. Messages left after 4 pm will be answered the following business day.   You may also send us  a message via MyChart. We typically respond to MyChart messages within 1-2 business days.  For prescription refills, please ask your pharmacy to contact our office. Our fax number is [phone].  If you have an urgent issue when the clinic is closed that cannot wait until the next business day, you can page your doctor at the number below.    Please note that while we do our best to be available for urgent issues outside of office hours, we are not available 24/7.   If you have an urgent issue and are unable to reach us , you may choose to seek medical care at your doctor's office, retail clinic, urgent care center, or emergency room.  If you have a medical emergency, please immediately call 911 or go to the emergency department. In the event of inclement weather, please call our main line at [phone] for an update on the status of any delays or closures.  Dermatology Medication Tips: Please keep the boxes that topical medications come in in order to help keep track of the instructions about where and how to use these. Pharmacies typically print the medication instructions only on the boxes and not directly  on the medication tubes.   If your medication is too expensive, please contact our office at [phone] or send us  a message through MyChart.   We are unable to tell what your co-pay for medications will be in advance as this is different depending on your insurance coverage. However, we may be able to find a substitute medication at lower cost or fill out paperwork to get insurance to cover a needed  medication.   If a prior authorization is required to get your medication covered by your insurance company, please allow us  1-2 business days to complete this process.  Drug prices often vary depending on where the prescription is filled and some pharmacies may offer cheaper prices.  The website www.goodrx.com contains coupons for medications through different pharmacies. The prices here do not account for what the cost may be with help from insurance (it may be cheaper with your insurance), but the website can give you the price if you did not use any insurance.  - You can print the associated coupon and take it with your prescription to the pharmacy.  - You may also stop by our office during regular business hours and pick up a GoodRx coupon card.  - If you need your prescription sent electronically to a different pharmacy, notify our office through [facility] or by phone at [phone]

## 2023-02-10 ENCOUNTER — Other Ambulatory Visit: Payer: Self-pay | Admitting: Dermatology

## 2023-02-10 DIAGNOSIS — L209 Atopic dermatitis, unspecified: Secondary | ICD-10-CM

## 2023-02-13 NOTE — Telephone Encounter (Signed)
 Yes, we can change to Tacrolimus 0.1% ointment.  Thanks!

## 2023-02-14 MED ORDER — TACROLIMUS 0.1 % EX OINT: TOPICAL_OINTMENT | Freq: Two times a day (BID) | CUTANEOUS | 5 refills | Status: AC

## 2023-02-16 DIAGNOSIS — H353211 Exudative age-related macular degeneration, right eye, with active choroidal neovascularization: Secondary | ICD-10-CM | POA: Diagnosis not present

## 2023-03-16 DIAGNOSIS — H353211 Exudative age-related macular degeneration, right eye, with active choroidal neovascularization: Secondary | ICD-10-CM | POA: Diagnosis not present

## 2023-03-16 DIAGNOSIS — H43813 Vitreous degeneration, bilateral: Secondary | ICD-10-CM | POA: Diagnosis not present

## 2023-03-16 DIAGNOSIS — H353122 Nonexudative age-related macular degeneration, left eye, intermediate dry stage: Secondary | ICD-10-CM | POA: Diagnosis not present

## 2023-03-16 DIAGNOSIS — H31092 Other chorioretinal scars, left eye: Secondary | ICD-10-CM | POA: Diagnosis not present

## 2023-03-16 DIAGNOSIS — H15833 Staphyloma posticum, bilateral: Secondary | ICD-10-CM | POA: Diagnosis not present

## 2023-04-06 DIAGNOSIS — J399 Disease of upper respiratory tract, unspecified: Secondary | ICD-10-CM | POA: Diagnosis not present

## 2023-04-06 DIAGNOSIS — R059 Cough, unspecified: Secondary | ICD-10-CM | POA: Diagnosis not present

## 2023-04-06 DIAGNOSIS — E782 Mixed hyperlipidemia: Secondary | ICD-10-CM | POA: Diagnosis not present

## 2023-04-06 DIAGNOSIS — E1169 Type 2 diabetes mellitus with other specified complication: Secondary | ICD-10-CM | POA: Diagnosis not present

## 2023-04-11 ENCOUNTER — Ambulatory Visit: Payer: Medicare Other | Admitting: Dermatology

## 2023-04-11 VITALS — BP 127/69

## 2023-04-11 DIAGNOSIS — L2989 Other pruritus: Secondary | ICD-10-CM

## 2023-04-11 DIAGNOSIS — L299 Pruritus, unspecified: Secondary | ICD-10-CM

## 2023-04-11 DIAGNOSIS — L209 Atopic dermatitis, unspecified: Secondary | ICD-10-CM | POA: Diagnosis not present

## 2023-04-11 DIAGNOSIS — L281 Prurigo nodularis: Secondary | ICD-10-CM | POA: Diagnosis not present

## 2023-04-11 MED ORDER — NEMLUVIO 30 MG ~~LOC~~ AUIJ: 60.0000 mg | AUTO-INJECTOR | SUBCUTANEOUS | 0 refills | Status: AC

## 2023-04-11 MED ORDER — NEMLUVIO 30 MG ~~LOC~~ AUIJ: 30.0000 mg | AUTO-INJECTOR | SUBCUTANEOUS | 11 refills | Status: DC

## 2023-04-11 NOTE — Patient Instructions (Signed)
 Dear Patient,  Thank you for visiting [place] today.   Below is a summary of our discussion and the next steps in your treatment plan:  Zoryve Usage: Continue using Zoryve consistently, as it can be applied daily without causing side effects.  Nemluvio Consideration:   Approval: Consider trying Nemluvio, a new medication option for your eczema, pending insurance approval and necessary forms.   Side Effects: We will review potential side effects, mainly focusing on injection site reactions and precautions with live vaccines.   Lab Tests:No preliminary lab tests are required before starting Nemluvio.  Skin Care Routine: Continue using Vanicream as part of your skin care routine.  Follow-Up Appointments:   Purpose: We will schedule follow-up appointments to monitor your progress and manage the injection training for Nemluvio.  Please do not hesitate to reach out if you have any questions or concerns about your treatment plan.  Warm regards,  Dr. Langston Reusing, Dermatology    Important Information  Due to recent changes in healthcare laws, you may see results of your pathology and/or laboratory studies on MyChart before the doctors have had a chance to review them. We understand that in some cases there may be results that are confusing or concerning to you. Please understand that not all results are received at the same time and often the doctors may need to interpret multiple results in order to provide you with the best plan of care or course of treatment. Therefore, we ask that you please give [place] 2 business days to thoroughly review all your results before contacting the office for clarification. Should we see a critical lab result, you will be contacted sooner.   If You Need Anything After Your Visit  If you have any questions or concerns for your doctor, please call our main line at [phone] If no one answers, please leave a voicemail as directed and we will return your call as  soon as possible. Messages left after 4 pm will be answered the following business day.   You may also send [place] a message via MyChart. We typically respond to MyChart messages within 1-2 business days.  For prescription refills, please ask your pharmacy to contact our office. Our fax number is [phone].  If you have an urgent issue when the clinic is closed that cannot wait until the next business day, you can page your doctor at the number below.    Please note that while we do our best to be available for urgent issues outside of office hours, we are not available 24/7.   If you have an urgent issue and are unable to reach [place], you may choose to seek medical care at your doctor's office, retail clinic, urgent care center, or emergency room.  If you have a medical emergency, please immediately call 911 or go to the emergency department. In the event of inclement weather, please call our main line at [phone] for an update on the status of any delays or closures.  Dermatology Medication Tips: Please keep the boxes that topical medications come in in order to help keep track of the instructions about where and how to use these. Pharmacies typically print the medication instructions only on the boxes and not directly on the medication tubes.   If your medication is too expensive, please contact our office at [phone] or send [place] a message through MyChart.   We are unable to tell what your co-pay for medications will be in advance as this is different depending  on your insurance coverage. However, we may be able to find a substitute medication at lower cost or fill out paperwork to get insurance to cover a needed medication.   If a prior authorization is required to get your medication covered by your insurance company, please allow [place] 1-2 business days to complete this process.  Drug prices often vary depending on where the prescription is filled and some pharmacies may offer cheaper  prices.  The website www.goodrx.com contains coupons for medications through different pharmacies. The prices here do not account for what the cost may be with help from insurance (it may be cheaper with your insurance), but the website can give you the price if you did not use any insurance.  - You can print the associated coupon and take it with your prescription to the pharmacy.  - You may also stop by our office during regular business hours and pick up a GoodRx coupon card.  - If you need your prescription sent electronically to a different pharmacy, notify our office through [facility] or by phone at [phone]

## 2023-04-11 NOTE — Progress Notes (Signed)
   Follow-Up Visit   Subjective  Erabella Kuipers Streat is a 73 y.o. female who presents for the following: Atopic Dermatitis follow up of face - she is treating with Zoryve twice daily but it does not seem to be helping. She did try TMC in the past. She has had this condition for about 10 years. The allergist had her on Dupixent for about 6 months but it did not seem to help.    The following portions of the chart were reviewed this encounter and updated as appropriate: medications, allergies, medical history  Review of Systems:  No other skin or systemic complaints except as noted in HPI or Assessment and Plan.  Objective  Well appearing patient in no apparent distress; mood and affect are within normal limits.  Areas Examined: Face   Relevant physical exam findings are noted in the Assessment and Plan.         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 Previous treatments including topical corticosteroids (triamcinolone), non-steroidal anti-inflammatory agents (Zoryve), and Dupixent injections have been ineffective. Daily itching persists despite topical medications. Condition affects face (darkening and itching) and approximately 20 prurigo nodules on arms, legs, and stomach. Patient is on valsartan hydrochlorothiazide for hypertension, potentially contributing to photosensitivity and skin darkening. Recent sun exposure during a cruise did not noticeably exacerbate symptoms.  - Plan:    Initiate Nemluvio (nemolizumab) injections, pending insurance approval and pre-treatment lab work    Museum/gallery curator for daily topical application    Maintain current antihistamine regimen (prescription, unspecified)    Discontinue triamcinolone use    Patient to sign consent forms for Nemluvio treatment     Schedule follow-up for injection training and detailed discussion of Nemluvio side effects and precautions    Continue use of Vanicream in morning and night for general skin care  Pt is not a candidate for methotrexate or cyclosporine due to inability for follow up labs and contraindications with other medications. Pt has no access to a light box for phototherapy.  Due to the progressive and chronic nature of her purigo nodularis and atopic dermatitis, patient has tried and failed numerous topicals creams as noted above, the next best therapeutic option is a systemic therapy. It is medically necessary to help improve her quality of life.   Pre-visit planning reviewing the last office visit, labs, imaging and care everywhere when applicable was 5 minutes Intra-visit was 20 minutes and included updating the relevant history, performing a video or in-person physical exam as appropriate, creating a treatment plan and used shared decision making with the patient.  Post-visit was 15 minutes that encompassed note completion, placing of orders, paperwork for biologics updating patient instructions and coordination of care.   Follow-up scheduled for injection training and discussion of Nemluvio treatment.         Avoid picking/rubbing/scratching   Return for Pending Nemluvio approval.  I, Joanie Coddington, CMA, am acting as scribe for [employer], DO .   Documentation: I have reviewed the above documentation for accuracy and completeness, and I agree with the above.  Langston Reusing, DO

## 2023-04-19 ENCOUNTER — Encounter: Payer: Self-pay | Admitting: Dermatology

## 2023-04-24 DIAGNOSIS — Z1231 Encounter for screening mammogram for malignant neoplasm of breast: Secondary | ICD-10-CM | POA: Diagnosis not present

## 2023-04-27 DIAGNOSIS — E782 Mixed hyperlipidemia: Secondary | ICD-10-CM | POA: Diagnosis not present

## 2023-04-27 DIAGNOSIS — M25511 Pain in right shoulder: Secondary | ICD-10-CM | POA: Diagnosis not present

## 2023-04-27 DIAGNOSIS — I10 Essential (primary) hypertension: Secondary | ICD-10-CM | POA: Diagnosis not present

## 2023-04-27 DIAGNOSIS — R7309 Other abnormal glucose: Secondary | ICD-10-CM | POA: Diagnosis not present

## 2023-04-27 DIAGNOSIS — E785 Hyperlipidemia, unspecified: Secondary | ICD-10-CM | POA: Diagnosis not present

## 2023-04-28 DIAGNOSIS — H43813 Vitreous degeneration, bilateral: Secondary | ICD-10-CM | POA: Diagnosis not present

## 2023-04-28 DIAGNOSIS — H15833 Staphyloma posticum, bilateral: Secondary | ICD-10-CM | POA: Diagnosis not present

## 2023-04-28 DIAGNOSIS — H353211 Exudative age-related macular degeneration, right eye, with active choroidal neovascularization: Secondary | ICD-10-CM | POA: Diagnosis not present

## 2023-04-28 DIAGNOSIS — H353122 Nonexudative age-related macular degeneration, left eye, intermediate dry stage: Secondary | ICD-10-CM | POA: Diagnosis not present

## 2023-04-28 DIAGNOSIS — H31092 Other chorioretinal scars, left eye: Secondary | ICD-10-CM | POA: Diagnosis not present

## 2023-05-02 DIAGNOSIS — M19011 Primary osteoarthritis, right shoulder: Secondary | ICD-10-CM | POA: Diagnosis not present

## 2023-05-05 DIAGNOSIS — M25511 Pain in right shoulder: Secondary | ICD-10-CM | POA: Diagnosis not present

## 2023-05-08 IMAGING — CR DG SHOULDER 2+V*R*
3 series · 3 of 3 positions shown · non-contrast
Comparison: No recent prior.

CLINICAL DATA: Right shoulder pain.  Decreased range of motion.

EXAM:
RIGHT SHOULDER - 2+ VIEW

[w shoulder ap internal righ *]
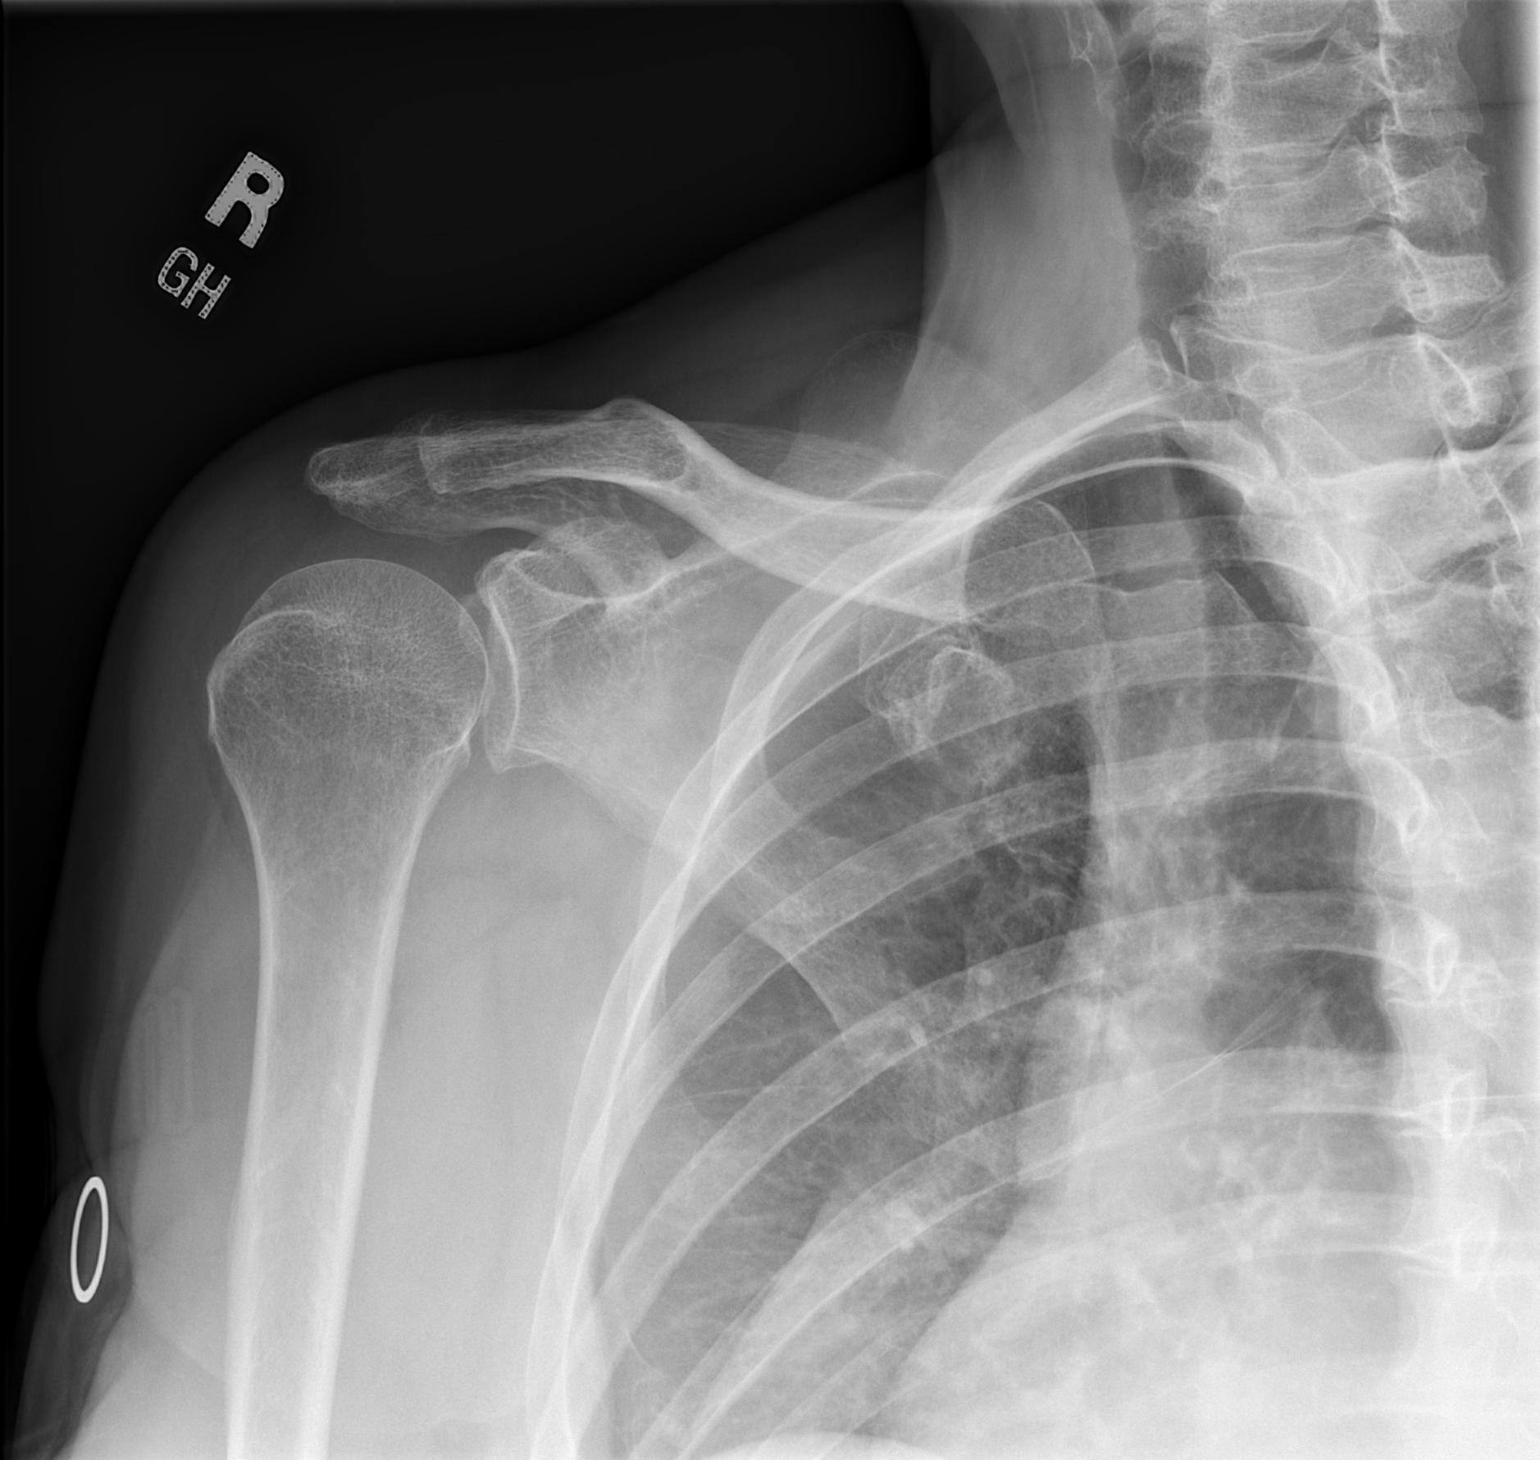

[w shoulder y view right]
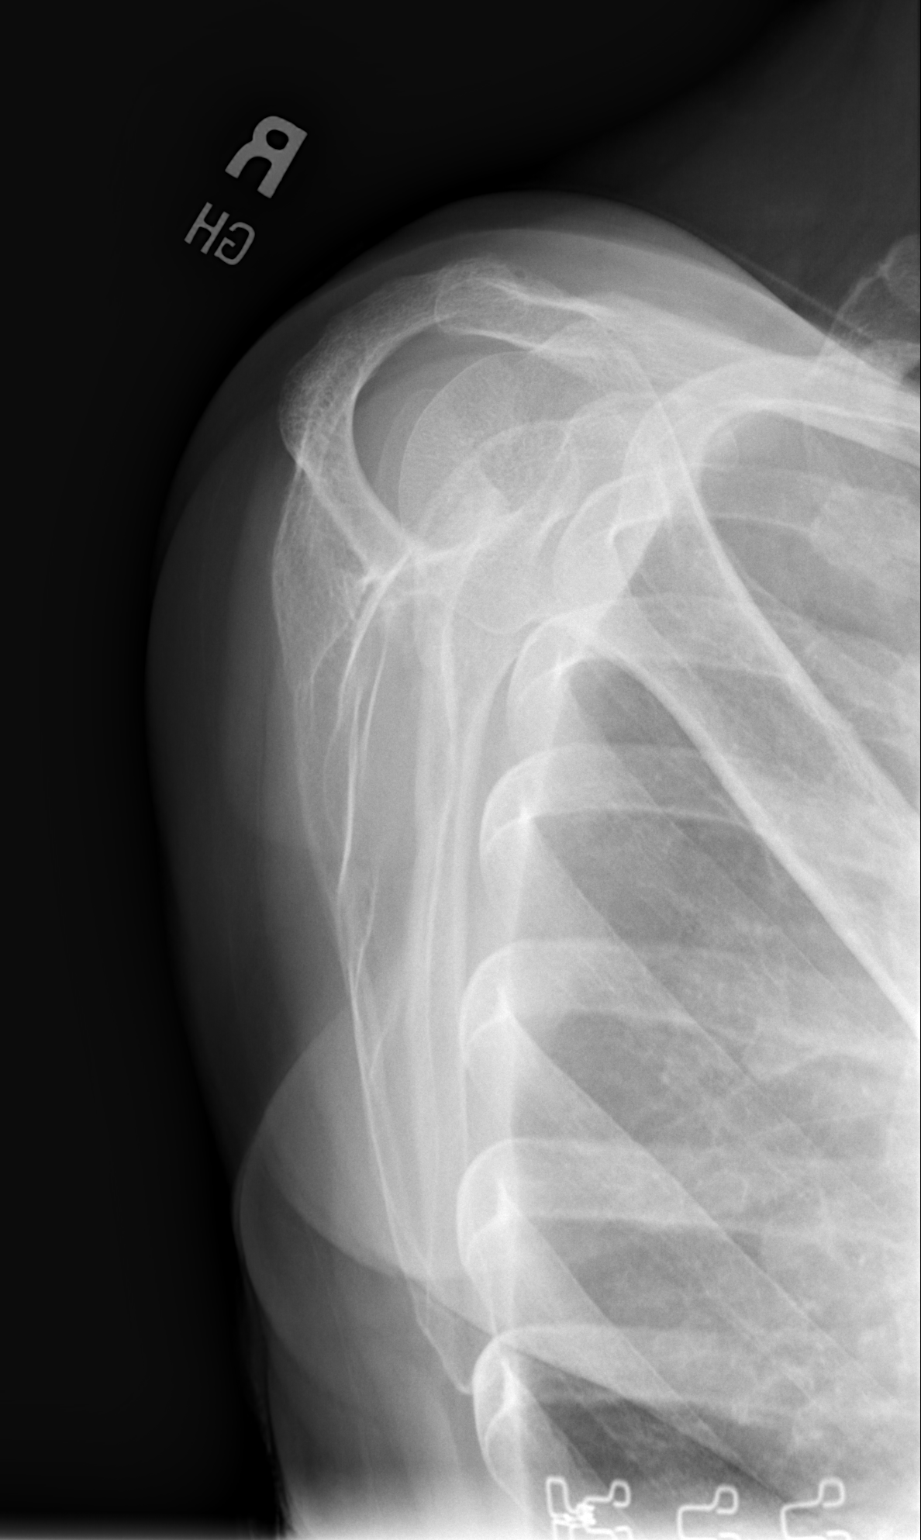

[w shoulder axillary right *]
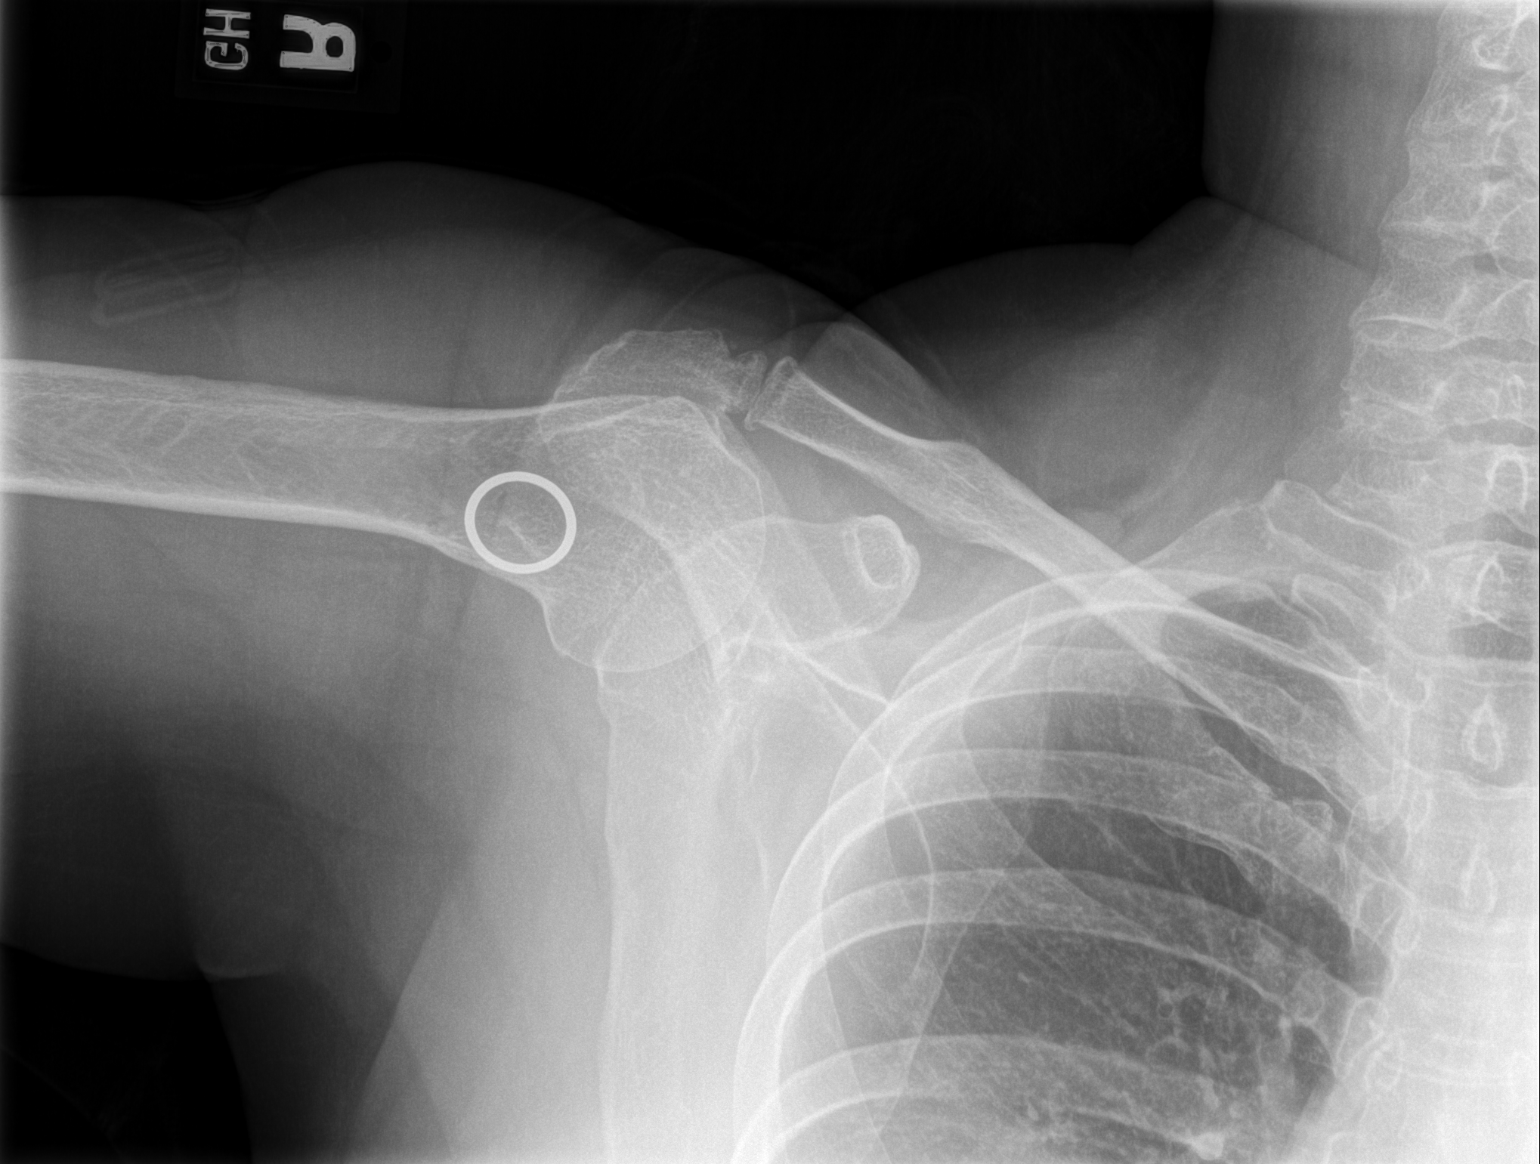

[3 of 3 positions shown; findings below may reference images not displayed]

FINDINGS: Acromioclavicular and glenohumeral degenerative change. No evidence
of fracture, dislocation, or separation.
IMPRESSION: Acromioclavicular and glenohumeral degenerative change. No acute
abnormality.

## 2023-05-11 DIAGNOSIS — I10 Essential (primary) hypertension: Secondary | ICD-10-CM | POA: Diagnosis not present

## 2023-05-11 DIAGNOSIS — M25511 Pain in right shoulder: Secondary | ICD-10-CM | POA: Diagnosis not present

## 2023-05-11 DIAGNOSIS — E785 Hyperlipidemia, unspecified: Secondary | ICD-10-CM | POA: Diagnosis not present

## 2023-05-11 DIAGNOSIS — R7309 Other abnormal glucose: Secondary | ICD-10-CM | POA: Diagnosis not present

## 2023-05-11 DIAGNOSIS — E782 Mixed hyperlipidemia: Secondary | ICD-10-CM | POA: Diagnosis not present

## 2023-05-22 ENCOUNTER — Telehealth: Payer: Self-pay

## 2023-05-22 NOTE — Progress Notes (Signed)
   05/22/2023  Patient ID: Virginia Turner, female   DOB: Jun 09, 1950, 73 y.o.   MRN: [id number]  Contacted patient regarding medication adherence from a quality report for [facility]. Patient also on PAP re-enrollment list for Janumet XR.    Left patient a voicemail to return my call at their convenience  Livia Riffle, PharmD Clinical Pharmacist  [phone]

## 2023-05-29 DIAGNOSIS — M25511 Pain in right shoulder: Secondary | ICD-10-CM | POA: Diagnosis not present

## 2023-05-29 DIAGNOSIS — I10 Essential (primary) hypertension: Secondary | ICD-10-CM | POA: Diagnosis not present

## 2023-05-29 DIAGNOSIS — E1169 Type 2 diabetes mellitus with other specified complication: Secondary | ICD-10-CM | POA: Diagnosis not present

## 2023-05-29 DIAGNOSIS — E782 Mixed hyperlipidemia: Secondary | ICD-10-CM | POA: Diagnosis not present

## 2023-05-29 DIAGNOSIS — E785 Hyperlipidemia, unspecified: Secondary | ICD-10-CM | POA: Diagnosis not present

## 2023-05-30 NOTE — Therapy (Unsigned)
 OUTPATIENT PHYSICAL THERAPY SHOULDER EVALUATION   Patient Name: Virginia Turner MRN: [id number] DOB:06/27/1950, 72 y.o., female Today's Date: 05/31/2023  END OF SESSION:  PT End of Session - 05/31/23 1451     Visit Number 1    Number of Visits 12    Date for PT Re-Evaluation 07/31/23    Authorization Type [employer] MCR    PT Start Time 1445    PT Stop Time 1530    PT Time Calculation (min) 45 min    Activity Tolerance Patient tolerated treatment well    Behavior During Therapy [facility] for tasks assessed/performed             No past medical history on file. No past surgical history on file. 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    PCP:  Jonathon Neighbors, MD   REFERRING PROVIDER: Shermon Divine, MD  REFERRING DIAG: R shoulder rc  tear  THERAPY DIAG:  Chronic right shoulder pain - Plan: PT plan of care cert/re-cert  Rotator cuff arthropathy, right - Plan: PT plan of care cert/re-cert  Muscle weakness (generalized) - Plan: PT plan of care cert/re-cert  Rationale for Evaluation and Treatment: Rehabilitation  ONSET DATE: 1 year  SUBJECTIVE:                                                                                                                                                                                      SUBJECTIVE STATEMENT: One year history of R shoulder pain.  Has had one CSI which did not help. Hand dominance: Right  PERTINENT HISTORY: None available  PAIN:  Are you having pain? Yes: NPRS scale: 9/10 Pain location: R shoulder Pain description: sharp Aggravating factors: OH reach, lifting  Relieving factors: rest   PRECAUTIONS: Other: RC tear(?)  RED FLAGS: None   WEIGHT BEARING RESTRICTIONS: No  FALLS:  Has patient fallen in last 6 months? No  OCCUPATION: retired  PLOF: Independent  PATIENT GOALS: To manage my shoulder pain  NEXT MD VISIT:   OBJECTIVE:   Note: Objective measures were completed at Evaluation unless otherwise noted.  DIAGNOSTIC FINDINGS:  none  PATIENT SURVEYS:  Quick Dash 32/55  POSTURE: Rounded shoulders  UPPER EXTREMITY ROM:   A/PROM Right eval Left eval  Shoulder flexion 110/110d   Shoulder extension    Shoulder abduction 125/90d   Shoulder adduction    Shoulder internal rotation /70d   Shoulder external rotation /70d   Elbow flexion    Elbow extension    Wrist flexion    Wrist extension    Wrist ulnar deviation  Wrist radial deviation    Wrist pronation    Wrist supination    (Blank rows = not tested)  UPPER EXTREMITY MMT:  MMT Right eval Left eval  Shoulder flexion 4-   Shoulder extension    Shoulder abduction 4-   Shoulder adduction    Shoulder internal rotation    Shoulder external rotation    Middle trapezius    Lower trapezius    Elbow flexion    Elbow extension    Wrist flexion    Wrist extension    Wrist ulnar deviation    Wrist radial deviation    Wrist pronation    Wrist supination    Grip strength (lbs)    (Blank rows = not tested)  SHOULDER SPECIAL TESTS: Impingement tests: Neer impingement test: positive  and Hawkins/Kennedy impingement test: positive   Rotator cuff assessment: Empty can test: positive , Full can test: positive , and Hornblower's sign: positive    JOINT MOBILITY TESTING:  deferred  PALPATION:  unremarkable                                                                                                                             TREATMENT DATE:  [facility] Adult PT Treatment:                                                DATE: 05/31/23 Eval and HEP Self Care: Additional minutes spent for educating on updated Therapeutic Home Exercise Program as well as comparing current status to condition at start of care. This included exercises focusing on stretching, strengthening, with focus on eccentric aspects. Long term goals include an improvement in range  of motion, strength, endurance as well as avoiding reinjury. Patient's frequency would include in 1-2 times a day, 3-5 times a week for a duration of 6-12 weeks. Proper technique shown and discussed handout in great detail. All questions were discussed and addressed.      PATIENT EDUCATION: Education details: Discussed eval findings, rehab rationale and POC and patient is in agreement  Person educated: Patient Education method: Explanation Education comprehension: verbalized understanding and needs further education  HOME EXERCISE PROGRAM: Access Code: [email or web address] URL: [email or web address] Date: 05/31/2023 Prepared by: Gretta Leavens  Exercises - Supine Shoulder Horizontal Abduction with Resistance  - 2-3 x daily - 5 x weekly - 3 sets - 10 reps - Supine Shoulder External Rotation with Resistance  - 2-3 x daily - 5 x weekly - 3 sets - 10 reps - Standing Shoulder Scaption  - 2-3 x daily - 5 x weekly - 3 sets - 10 reps  ASSESSMENT:  CLINICAL IMPRESSION: Patient is a 73 y.o. female who was seen today for physical therapy evaluation and treatment for chronic R shoulder pain. Patient presents with limited AROM and PROM, R RC weakness and positive  signs of impingement and possible partial RC tear.  Prognosis is fair based on chronicity, unsuccessful CSI and possible tear.  OBJECTIVE IMPAIRMENTS: decreased knowledge of condition, decreased mobility, decreased ROM, decreased strength, impaired UE functional use, and pain.   ACTIVITY LIMITATIONS: carrying, lifting, sleeping, dressing, and reach over head  PERSONAL FACTORS: Age, Past/current experiences, and Time since onset of injury/illness/exacerbation are also affecting patient's functional outcome.   REHAB POTENTIAL: Fair due to chronicity and suspected   CLINICAL DECISION MAKING: Evolving/moderate complexity  EVALUATION COMPLEXITY: Low   GOALS: Goals reviewed with patient? No  SHORT TERM GOALS: Target date: 06/21/2023     Patient to demonstrate independence in HEP  Baseline:  [id number] Goal status: INITIAL  2.  160d AROM flexion/abd Baseline:  A/PROM Right eval Left eval  Shoulder flexion 110/110d   Shoulder extension    Shoulder abduction 125/90d    Goal status: INITIAL   LONG TERM GOALS: Target date: 07/12/2023  Patient will acknowledge 6/10 pain at least once during episode of care   Baseline: 9/10 Goal status: INITIAL  2.  Patient will score at least 25/55 on DASH to signify clinically meaningful improvement in functional abilities.   Baseline: 32/55 Goal status: INITIAL  3.  Increase R shoulder strength to 4/5 Baseline:  MMT Right eval Left eval  Shoulder flexion 4-   Shoulder extension    Shoulder abduction 4-    Goal status: INITIAL    PLAN:  PT FREQUENCY: 1-2x/week  PT DURATION: 6 weeks  PLANNED INTERVENTIONS: 97164- PT Re-evaluation, 97110-Therapeutic exercises, 97530- Therapeutic activity, 97112- Neuromuscular re-education, 97535- Self Care, 54098- Manual therapy, and Patient/Family education  PLAN FOR NEXT SESSION: HEP review and update, manual techniques as appropriate, aerobic tasks, ROM and flexibility activities, strengthening and PREs, TPDN, gait and balance training as needed    Date of referral: 05/02/23 Referring provider: Shermon Divine, MD Referring diagnosis? R shoulder rc  tear Treatment diagnosis? (if different than referring diagnosis) R shoulder rc  tear  What was this (referring dx) caused by? Ongoing Issue  Lonne Roan of Condition: Chronic (continuous duration > 3 months)   Laterality: Rt  Current Functional Measure Score: DASH 32/55  Objective measurements identify impairments when they are compared to normal values, the uninvolved extremity, and prior level of function.  [x]  Yes  []  No  Objective assessment of functional ability: Moderate functional limitations   Briefly describe symptoms: pain with RUE motion  How did symptoms start:  insidiously  Average pain intensity:  Last 24 hours: 6  Past week: 6  How often does the pt experience symptoms? Frequently  How much have the symptoms interfered with usual daily activities? Quite a bit  How has condition changed since care began at this facility? NA - initial visit  In general, how is the patients overall health? Good   BACK PAIN (STarT Back Screening Tool) No   Eldon Greenland, PT 05/31/2023, 3:45 PM

## 2023-05-31 ENCOUNTER — Other Ambulatory Visit: Payer: Self-pay

## 2023-05-31 ENCOUNTER — Ambulatory Visit: Attending: Family Medicine

## 2023-05-31 DIAGNOSIS — M25511 Pain in right shoulder: Secondary | ICD-10-CM | POA: Diagnosis not present

## 2023-05-31 DIAGNOSIS — M12811 Other specific arthropathies, not elsewhere classified, right shoulder: Secondary | ICD-10-CM | POA: Diagnosis not present

## 2023-05-31 DIAGNOSIS — G8929 Other chronic pain: Secondary | ICD-10-CM | POA: Insufficient documentation

## 2023-05-31 DIAGNOSIS — M6281 Muscle weakness (generalized): Secondary | ICD-10-CM | POA: Insufficient documentation

## 2023-06-13 NOTE — Therapy (Unsigned)
 OUTPATIENT PHYSICAL THERAPY TREATMENT NOTE   Patient Name: Virginia Turner MRN: [id number] DOB:07-30-1950, 73 y.o., female Today's Date: 06/14/2023  END OF SESSION:  PT End of Session - 06/14/23 1535     Visit Number 2    Number of Visits 12    Date for PT Re-Evaluation 07/31/23    Authorization Type [employer] MCR    PT Start Time 1535    PT Stop Time 1615    PT Time Calculation (min) 40 min    Activity Tolerance Patient tolerated treatment well    Behavior During Therapy WFL for tasks assessed/performed              History reviewed. No pertinent past medical history. History reviewed. No pertinent surgical history. 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 Hand dominance: Right  PERTINENT HISTORY: None available  PAIN:  Are you having pain? Yes: NPRS scale: 9/10 Pain location: R shoulder Pain description: sharp Aggravating factors: OH reach, lifting  Relieving factors: rest   PRECAUTIONS: Other: RC tear(?)  RED FLAGS: None   WEIGHT BEARING RESTRICTIONS: No  FALLS:  Has patient fallen in last 6 months? No  OCCUPATION: retired  PLOF: Independent  PATIENT GOALS: To manage my shoulder pain  NEXT MD VISIT:   OBJECTIVE:  Note: Objective measures were completed at Evaluation unless otherwise  noted.  DIAGNOSTIC FINDINGS:  none  PATIENT SURVEYS:  Quick Dash 32/55  POSTURE: Rounded shoulders  UPPER EXTREMITY ROM:   A/PROM Right eval Left eval  Shoulder flexion 110/110d   Shoulder extension    Shoulder abduction 125/90d   Shoulder adduction    Shoulder internal rotation /70d   Shoulder external rotation /70d   Elbow flexion    Elbow extension    Wrist flexion    Wrist extension    Wrist ulnar deviation    Wrist radial deviation    Wrist pronation    Wrist supination    (Blank rows =  not tested)  UPPER EXTREMITY MMT:  MMT Right eval Left eval  Shoulder flexion 4-   Shoulder extension    Shoulder abduction 4-   Shoulder adduction    Shoulder internal rotation    Shoulder external rotation    Middle trapezius    Lower trapezius    Elbow flexion    Elbow extension    Wrist flexion    Wrist extension    Wrist ulnar deviation    Wrist radial deviation    Wrist pronation    Wrist supination    Grip strength (lbs)    (Blank rows = not tested)  SHOULDER SPECIAL TESTS: Impingement tests: Neer impingement test: positive  and Hawkins/Kennedy impingement test: positive   Rotator cuff assessment: Empty can test: positive , Full can test: positive , and Hornblower's sign: positive    JOINT MOBILITY TESTING:  deferred  PALPATION:  unremarkable                                                                                                                             TREATMENT DATE:  [facility] Adult PT Treatment:                                                DATE: 06/15/23 Therapeutic Exercise: Nustep L2 Neuromuscular re-ed: 4 way scapula 15x PNF D1 F/E 15x R pec minor release 2 min Seated scaption 15x Therapeutic Activity: Supine flexion 15x Supine press 15x Scaption on wall 15x2  [facility] Adult PT Treatment:                                                DATE: 05/31/23 Eval and HEP Self Care: Additional minutes spent for educating on updated  Therapeutic Home Exercise Program as well as comparing current status to condition at start of care. This included exercises focusing on stretching, strengthening, with focus on eccentric aspects. Long term goals include an improvement in range of motion, strength, endurance as well as avoiding reinjury. Patient's frequency would include in 1-2 times a day, 3-5 times a week for a duration of 6-12 weeks. Proper technique shown and discussed handout in great detail. All questions were discussed and addressed.      PATIENT EDUCATION: Education details: Discussed eval findings, rehab rationale and POC and patient is in agreement  Person educated: Patient Education method: Explanation Education comprehension: verbalized understanding and needs further education  HOME EXERCISE PROGRAM: Access Code: [email or web address] URL: [email or web address] Date: 05/31/2023 Prepared by: Gretta Leavens  Exercises - Supine Shoulder Horizontal Abduction with Resistance  - 2-3 x daily - 5 x weekly - 3 sets - 10 reps - Supine Shoulder External Rotation with Resistance  -  2-3 x daily - 5 x weekly - 3 sets - 10 reps - Standing Shoulder Scaption  - 2-3 x daily - 5 x weekly - 3 sets - 10 reps  ASSESSMENT:  CLINICAL IMPRESSION: Focus of session session was ROM, posture retraining and periscapular strengthening.  Able to progress through all tasks with only mild discomfort due to forward shoulder posturing and altered mechanics Patient is a 73 y.o. female who was seen today for physical therapy evaluation and treatment for chronic R shoulder pain. Patient presents with limited AROM and PROM, R RC weakness and positive signs of impingement and possible partial RC tear.  Prognosis is fair based on chronicity, unsuccessful CSI and possible tear.  OBJECTIVE IMPAIRMENTS: decreased knowledge of condition, decreased mobility, decreased ROM, decreased strength, impaired UE functional use, and pain.   ACTIVITY LIMITATIONS:  carrying, lifting, sleeping, dressing, and reach over head  PERSONAL FACTORS: Age, Past/current experiences, and Time since onset of injury/illness/exacerbation are also affecting patient's functional outcome.   REHAB POTENTIAL: Fair due to chronicity and suspected   CLINICAL DECISION MAKING: Evolving/moderate complexity  EVALUATION COMPLEXITY: Low   GOALS: Goals reviewed with patient? No  SHORT TERM GOALS: Target date: 06/21/2023    Patient to demonstrate independence in HEP  Baseline:  [id number] Goal status: INITIAL  2.  160d AROM flexion/abd Baseline:  A/PROM Right eval Left eval  Shoulder flexion 110/110d   Shoulder extension    Shoulder abduction 125/90d    Goal status: INITIAL   LONG TERM GOALS: Target date: 07/12/2023  Patient will acknowledge 6/10 pain at least once during episode of care   Baseline: 9/10 Goal status: INITIAL  2.  Patient will score at least 25/55 on DASH to signify clinically meaningful improvement in functional abilities.   Baseline: 32/55 Goal status: INITIAL  3.  Increase R shoulder strength to 4/5 Baseline:  MMT Right eval Left eval  Shoulder flexion 4-   Shoulder extension    Shoulder abduction 4-    Goal status: INITIAL    PLAN:  PT FREQUENCY: 1-2x/week  PT DURATION: 6 weeks  PLANNED INTERVENTIONS: 97164- PT Re-evaluation, 97110-Therapeutic exercises, 97530- Therapeutic activity, 97112- Neuromuscular re-education, 97535- Self Care, 84696- Manual therapy, and Patient/Family education  PLAN FOR NEXT SESSION: HEP review and update, manual techniques as appropriate, aerobic tasks, ROM and flexibility activities, strengthening and PREs, TPDN, gait and balance training as needed    Date of referral: 05/02/23 Referring provider: Shermon Divine, MD Referring diagnosis? R shoulder rc  tear Treatment diagnosis? (if different than referring diagnosis) R shoulder rc  tear  What was this (referring dx) caused by? Ongoing  Issue  Lonne Roan of Condition: Chronic (continuous duration > 3 months)   Laterality: Rt  Current Functional Measure Score: DASH 32/55  Objective measurements identify impairments when they are compared to normal values, the uninvolved extremity, and prior level of function.  [x]  Yes  []  No  Objective assessment of functional ability: Moderate functional limitations   Briefly describe symptoms: pain with RUE motion  How did symptoms start: insidiously  Average pain intensity:  Last 24 hours: 6  Past week: 6  How often does the pt experience symptoms? Frequently  How much have the symptoms interfered with usual daily activities? Quite a bit  How has condition changed since care began at this facility? NA - initial visit  In general, how is the patients overall health? Good   BACK PAIN (STarT Back Screening Tool) No   Belissa Kooy M Kalena Mander, PT  06/14/2023, 4:15 PM

## 2023-06-14 ENCOUNTER — Ambulatory Visit: Attending: Sports Medicine

## 2023-06-14 DIAGNOSIS — M6281 Muscle weakness (generalized): Secondary | ICD-10-CM | POA: Insufficient documentation

## 2023-06-14 DIAGNOSIS — M12811 Other specific arthropathies, not elsewhere classified, right shoulder: Secondary | ICD-10-CM | POA: Diagnosis not present

## 2023-06-14 DIAGNOSIS — M25511 Pain in right shoulder: Secondary | ICD-10-CM | POA: Insufficient documentation

## 2023-06-14 DIAGNOSIS — G8929 Other chronic pain: Secondary | ICD-10-CM | POA: Insufficient documentation

## 2023-06-16 ENCOUNTER — Ambulatory Visit

## 2023-06-16 DIAGNOSIS — M12811 Other specific arthropathies, not elsewhere classified, right shoulder: Secondary | ICD-10-CM

## 2023-06-16 DIAGNOSIS — G8929 Other chronic pain: Secondary | ICD-10-CM | POA: Diagnosis not present

## 2023-06-16 DIAGNOSIS — M25511 Pain in right shoulder: Secondary | ICD-10-CM | POA: Diagnosis not present

## 2023-06-16 DIAGNOSIS — M6281 Muscle weakness (generalized): Secondary | ICD-10-CM | POA: Diagnosis not present

## 2023-06-16 NOTE — Therapy (Signed)
 OUTPATIENT PHYSICAL THERAPY TREATMENT NOTE   Patient Name: Virginia Turner MRN: [id number] DOB:1950/10/29, 73 y.o., female Today's Date: 06/16/2023  END OF SESSION:  PT End of Session - 06/16/23 1219     Visit Number 3    Number of Visits 12    Date for PT Re-Evaluation 07/31/23    Authorization Type [employer] MCR    PT Start Time 1215    PT Stop Time 1255    PT Time Calculation (min) 40 min    Activity Tolerance Patient tolerated treatment well    Behavior During Therapy [facility] for tasks assessed/performed              History reviewed. No pertinent past medical history. History reviewed. No pertinent surgical history. 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  Symptoms isolated to R AC joint Hand dominance: Right  PERTINENT HISTORY: None available  PAIN:  Are you having pain? Yes: NPRS scale: 9/10 Pain location: R shoulder Pain description: sharp Aggravating factors: OH reach, lifting  Relieving factors: rest   PRECAUTIONS: Other: RC tear(?)  RED FLAGS: None   WEIGHT BEARING RESTRICTIONS: No  FALLS:  Has patient fallen in last 6 months? No  OCCUPATION: retired  PLOF: Independent  PATIENT GOALS: To manage my shoulder pain  NEXT MD VISIT:   OBJECTIVE:  Note: Objective measures were completed at Evaluation unless otherwise  noted.  DIAGNOSTIC FINDINGS:  none  PATIENT SURVEYS:  Quick Dash 32/55  POSTURE: Rounded shoulders  UPPER EXTREMITY ROM:   A/PROM Right eval Left eval  Shoulder flexion 110/110d   Shoulder extension    Shoulder abduction 125/90d   Shoulder adduction    Shoulder internal rotation /70d   Shoulder external rotation /70d   Elbow flexion    Elbow extension    Wrist flexion    Wrist extension    Wrist ulnar deviation    Wrist radial deviation    Wrist pronation    Wrist supination    (Blank rows = not tested)  UPPER EXTREMITY MMT:  MMT Right eval Left eval  Shoulder flexion 4-   Shoulder extension    Shoulder abduction 4-   Shoulder adduction    Shoulder internal rotation    Shoulder external rotation    Middle trapezius    Lower trapezius    Elbow flexion    Elbow extension    Wrist flexion    Wrist extension    Wrist ulnar deviation    Wrist radial deviation    Wrist pronation    Wrist supination    Grip strength (lbs)    (Blank rows = not tested)  SHOULDER SPECIAL TESTS: Impingement tests: Neer impingement test: positive  and Hawkins/Kennedy impingement test: positive   Rotator cuff assessment: Empty can test: positive , Full can test: positive , and Hornblower's sign: positive    JOINT MOBILITY TESTING:  deferred  PALPATION:  unremarkable                                                                                                                             TREATMENT DATE:  [facility] Adult PT Treatment:                                                DATE: 06/16/23 Therapeutic Exercise: Nustep L3 8 min Neuromuscular re-ed: 4 way scapula 15x PNF D1 F/E 15x Prone extension 15x Prone scaption 15x Prone hor abd 15x Prone row 15x Therapeutic Activity: Supine flexion 15x Ranger Supine press 15x Ranger INF/POST/DISTRACT mobs 5x10   [facility] Adult PT Treatment:                                                DATE: 06/15/23 Therapeutic  Exercise: Nustep L2 Neuromuscular re-ed: 4 way scapula 15x PNF D1 F/E 15x R pec minor release 2 min Seated scaption 15x Therapeutic Activity: Supine flexion 15x Supine press 15x Scaption on wall 15x2  [facility] Adult PT Treatment:                                                DATE: 05/31/23 Eval and HEP Self Care: Additional minutes spent for educating on updated Therapeutic Home Exercise Program as well as comparing current status to condition at start of care. This included exercises focusing on stretching, strengthening, with focus on eccentric aspects. Long term goals include an improvement in range of motion, strength, endurance as well as avoiding reinjury. Patient's frequency would include in 1-2 times a day, 3-5 times a week for a duration of 6-12 weeks. Proper technique shown and discussed  handout in great detail. All questions were discussed and addressed.      PATIENT EDUCATION: Education details: Discussed eval findings, rehab rationale and POC and patient is in agreement  Person educated: Patient Education method: Explanation Education comprehension: verbalized understanding and needs further education  HOME EXERCISE PROGRAM: Access Code: [email or web address] URL: [email or web address] Date: 05/31/2023 Prepared by: Gretta Leavens  Exercises - Supine Shoulder Horizontal Abduction with Resistance  - 2-3 x daily - 5 x weekly - 3 sets - 10 reps - Supine Shoulder External Rotation with Resistance  - 2-3 x daily - 5 x weekly - 3 sets - 10 reps - Standing Shoulder Scaption  - 2-3 x daily - 5 x weekly - 3 sets - 10 reps  ASSESSMENT:  CLINICAL IMPRESSION: Introduced prone exercises for posterior shoulder strengthening and scapular stabilizer control.  Able to tolerate new tasks w/o increased symptoms.  Introduced gentle shoulder mobs for pain relief and restoration of posture.  Pec minor irritation resolved.  Patient is a 73 y.o. female who was seen today for physical therapy  evaluation and treatment for chronic R shoulder pain. Patient presents with limited AROM and PROM, R RC weakness and positive signs of impingement and possible partial RC tear.  Prognosis is fair based on chronicity, unsuccessful CSI and possible tear.  OBJECTIVE IMPAIRMENTS: decreased knowledge of condition, decreased mobility, decreased ROM, decreased strength, impaired UE functional use, and pain.   ACTIVITY LIMITATIONS: carrying, lifting, sleeping, dressing, and reach over head  PERSONAL FACTORS: Age, Past/current experiences, and Time since onset of injury/illness/exacerbation are also affecting patient's functional outcome.   REHAB POTENTIAL: Fair due to chronicity and suspected   CLINICAL DECISION MAKING: Evolving/moderate complexity  EVALUATION COMPLEXITY: Low   GOALS: Goals reviewed with patient? No  SHORT TERM GOALS: Target date: 06/21/2023    Patient to demonstrate independence in HEP  Baseline:  [id number] Goal status: INITIAL  2.  160d AROM flexion/abd Baseline:  A/PROM Right eval Left eval  Shoulder flexion 110/110d   Shoulder extension    Shoulder abduction 125/90d    Goal status: INITIAL   LONG TERM GOALS: Target date: 07/12/2023  Patient will acknowledge 6/10 pain at least once during episode of care   Baseline: 9/10 Goal status: INITIAL  2.  Patient will score at least 25/55 on DASH to signify clinically meaningful improvement in functional abilities.   Baseline: 32/55 Goal status: INITIAL  3.  Increase R shoulder strength to 4/5 Baseline:  MMT Right eval Left eval  Shoulder flexion 4-   Shoulder extension    Shoulder abduction 4-    Goal status: INITIAL    PLAN:  PT FREQUENCY: 1-2x/week  PT DURATION: 6 weeks  PLANNED INTERVENTIONS: 97164- PT Re-evaluation, 97110-Therapeutic exercises, 97530- Therapeutic activity, 97112- Neuromuscular re-education, 97535- Self Care, 13086- Manual therapy, and Patient/Family education  PLAN FOR NEXT  SESSION: HEP review and update, manual techniques as appropriate, aerobic tasks, ROM and flexibility activities, strengthening and PREs, TPDN, gait and balance training as needed    Date of referral: 05/02/23 Referring provider: Shermon Divine, MD Referring diagnosis? R shoulder rc  tear Treatment diagnosis? (if different than referring diagnosis) R shoulder rc  tear  What was this (referring dx) caused by? Ongoing Issue  Lonne Roan of Condition: Chronic (continuous duration > 3 months)   Laterality: Rt  Current Functional Measure Score: DASH 32/55  Objective measurements identify impairments when they are compared to normal values, the uninvolved extremity, and prior level of function.  [x]  Yes  []   No  Objective assessment of functional ability: Moderate functional limitations   Briefly describe symptoms: pain with RUE motion  How did symptoms start: insidiously  Average pain intensity:  Last 24 hours: 6  Past week: 6  How often does the pt experience symptoms? Frequently  How much have the symptoms interfered with usual daily activities? Quite a bit  How has condition changed since care began at this facility? NA - initial visit  In general, how is the patients overall health? Good   BACK PAIN (STarT Back Screening Tool) No   Eldon Greenland, PT 06/16/2023, 12:58 PM

## 2023-06-21 ENCOUNTER — Ambulatory Visit

## 2023-06-21 DIAGNOSIS — M6281 Muscle weakness (generalized): Secondary | ICD-10-CM

## 2023-06-21 DIAGNOSIS — G8929 Other chronic pain: Secondary | ICD-10-CM

## 2023-06-21 DIAGNOSIS — M12811 Other specific arthropathies, not elsewhere classified, right shoulder: Secondary | ICD-10-CM | POA: Diagnosis not present

## 2023-06-21 DIAGNOSIS — M25511 Pain in right shoulder: Secondary | ICD-10-CM | POA: Diagnosis not present

## 2023-06-21 NOTE — Therapy (Signed)
 OUTPATIENT PHYSICAL THERAPY TREATMENT NOTE   Patient Name: Virginia Turner MRN: [id number] DOB:11-22-1950, 73 y.o., female Today's Date: 06/22/2023  END OF SESSION:  PT End of Session - 06/21/23 1446     Visit Number 4    Number of Visits 12    Date for PT Re-Evaluation 07/31/23    Authorization Type [employer] MCR    PT Start Time 1446    PT Stop Time 1525    PT Time Calculation (min) 39 min    Activity Tolerance Patient tolerated treatment well    Behavior During Therapy WFL for tasks assessed/performed              History reviewed. No pertinent past medical history. History reviewed. No pertinent surgical history. 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    Hand dominance: Right  PERTINENT HISTORY: None available  PAIN:  Are you having pain? Yes: NPRS scale: 9/10 Pain location: R shoulder Pain description: sharp Aggravating factors: OH reach, lifting  Relieving factors: rest   PRECAUTIONS: Other: RC tear(?)  RED FLAGS: None   WEIGHT BEARING RESTRICTIONS: No  FALLS:  Has patient fallen in last 6 months? No  OCCUPATION: retired  PLOF: Independent  PATIENT GOALS: To manage my shoulder pain  NEXT MD VISIT:   OBJECTIVE:  Note: Objective measures were completed at Evaluation unless otherwise noted.  DIAGNOSTIC FINDINGS:   none  PATIENT SURVEYS:  Quick Dash 32/55  POSTURE: Rounded shoulders  UPPER EXTREMITY ROM:   A/PROM Right eval Left eval  Shoulder flexion 110/110d   Shoulder extension    Shoulder abduction 125/90d   Shoulder adduction    Shoulder internal rotation /70d   Shoulder external rotation /70d   Elbow flexion    Elbow extension    Wrist flexion    Wrist extension    Wrist ulnar deviation    Wrist radial deviation    Wrist pronation    Wrist supination    (Blank rows = not tested)  UPPER EXTREMITY MMT:  MMT Right eval Left eval  Shoulder flexion 4-   Shoulder extension    Shoulder abduction 4-   Shoulder adduction    Shoulder internal rotation    Shoulder external rotation    Middle trapezius    Lower trapezius    Elbow flexion    Elbow extension    Wrist flexion    Wrist extension    Wrist ulnar deviation    Wrist radial deviation    Wrist pronation    Wrist supination    Grip strength (lbs)    (Blank rows = not tested)  SHOULDER SPECIAL TESTS: Impingement tests: Neer impingement test: positive  and Hawkins/Kennedy impingement test: positive   Rotator cuff assessment: Empty can test: positive , Full can test: positive , and Hornblower's sign: positive    JOINT MOBILITY TESTING:  deferred  PALPATION:  unremarkable                                                                                                                             TREATMENT DATE:    [facility] Adult PT Treatment:                                                DATE: 06/21/2023  Therapeutic Exercise: Nustep L3 8 min  Neuromuscular re-ed: PNF D1 F/E 10x Prone extension 15x Prone scaption 15x Prone hor abd 15x Prone row 15x  Manual Therapy:  Supine flexion 15x Ranger Supine press 15x Ranger INF/POST/DISTRACT mobs 5x10   [facility] Adult PT Treatment:                                                DATE: 06/16/23 Therapeutic Exercise: Nustep L3 8 min Neuromuscular re-ed: 4 way  scapula 15x PNF D1 F/E 15x Prone extension 15x Prone scaption 15x Prone hor abd 15x Prone row 15x Therapeutic Activity: Supine flexion 15x Ranger Supine press 15x Ranger INF/POST/DISTRACT mobs 5x10   [facility] Adult PT Treatment:                                                DATE: 06/15/23 Therapeutic Exercise: Nustep L2 Neuromuscular re-ed: 4 way scapula 15x PNF D1 F/E 15x R pec minor release 2 min Seated scaption 15x Therapeutic Activity: Supine flexion 15x Supine press 15x Scaption on wall 15x2  [facility] Adult PT Treatment:  DATE: 05/31/23 Eval and HEP Self Care: Additional minutes spent for educating on updated Therapeutic Home Exercise Program as well as comparing current status to condition at start of care. This included exercises focusing on stretching, strengthening, with focus on eccentric aspects. Long term goals include an improvement in range of motion, strength, endurance as well as avoiding reinjury. Patient's frequency would include in 1-2 times a day, 3-5 times a week for a duration of 6-12 weeks. Proper technique shown and discussed handout in great detail. All questions were discussed and addressed.      PATIENT EDUCATION: Education details: Discussed eval findings, rehab rationale and POC and patient is in agreement  Person educated: Patient Education method: Explanation Education comprehension: verbalized understanding and needs further education  HOME EXERCISE PROGRAM: Access Code: [email or web address] URL: [email or web address] Date: 05/31/2023 Prepared by: Gretta Leavens  Exercises - Supine Shoulder Horizontal Abduction with Resistance  - 2-3 x daily - 5 x weekly - 3 sets - 10 reps - Supine Shoulder External Rotation with Resistance  - 2-3 x daily - 5 x weekly - 3 sets - 10 reps - Standing Shoulder Scaption  - 2-3 x daily - 5 x weekly - 3 sets - 10 reps  ASSESSMENT:  CLINICAL IMPRESSION:   Kentrell had good  tolerance of today's treatment session, which focused on ongoing shoulder AROM and scapular stabilization activities. She will benefit from progression of current POC as tolerated.    Patient is a 73 y.o. female who was seen today for physical therapy evaluation and treatment for chronic R shoulder pain. Patient presents with limited AROM and PROM, R RC weakness and positive signs of impingement and possible partial RC tear.  Prognosis is fair based on chronicity, unsuccessful CSI and possible tear.  OBJECTIVE IMPAIRMENTS: decreased knowledge of condition, decreased mobility, decreased ROM, decreased strength, impaired UE functional use, and pain.   ACTIVITY LIMITATIONS: carrying, lifting, sleeping, dressing, and reach over head  PERSONAL FACTORS: Age, Past/current experiences, and Time since onset of injury/illness/exacerbation are also affecting patient's functional outcome.   REHAB POTENTIAL: Fair due to chronicity and suspected   CLINICAL DECISION MAKING: Evolving/moderate complexity  EVALUATION COMPLEXITY: Low   GOALS: Goals reviewed with patient? No  SHORT TERM GOALS: Target date: 06/21/2023    Patient to demonstrate independence in HEP  Baseline:  [id number] Goal status: INITIAL  2.  160d AROM flexion/abd Baseline:  A/PROM Right eval Left eval  Shoulder flexion 110/110d   Shoulder extension    Shoulder abduction 125/90d    Goal status: INITIAL   LONG TERM GOALS: Target date: 07/12/2023  Patient will acknowledge 6/10 pain at least once during episode of care   Baseline: 9/10 Goal status: INITIAL  2.  Patient will score at least 25/55 on DASH to signify clinically meaningful improvement in functional abilities.   Baseline: 32/55 Goal status: INITIAL  3.  Increase R shoulder strength to 4/5 Baseline:  MMT Right eval Left eval  Shoulder flexion 4-   Shoulder extension    Shoulder abduction 4-    Goal status: INITIAL    PLAN:  PT FREQUENCY:  1-2x/week  PT DURATION: 6 weeks  PLANNED INTERVENTIONS: 97164- PT Re-evaluation, 97110-Therapeutic exercises, 97530- Therapeutic activity, 97112- Neuromuscular re-education, 97535- Self Care, 95621- Manual therapy, and Patient/Family education  PLAN FOR NEXT SESSION: HEP review and update, manual techniques as appropriate, aerobic tasks, ROM and flexibility activities, strengthening and PREs, TPDN, gait and balance training as needed    Date of referral: 05/02/23 Referring provider:  Shermon Divine, MD Referring diagnosis? R shoulder rc  tear Treatment diagnosis? (if different than referring diagnosis) R shoulder rc  tear  What was this (referring dx) caused by? Ongoing Issue  Lonne Roan of Condition: Chronic (continuous duration > 3 months)   Laterality: Rt  Current Functional Measure Score: DASH 32/55  Objective measurements identify impairments when they are compared to normal values, the uninvolved extremity, and prior level of function.  [x]  Yes  []  No  Objective assessment of functional ability: Moderate functional limitations   Briefly describe symptoms: pain with RUE motion  How did symptoms start: insidiously  Average pain intensity:  Last 24 hours: 6  Past week: 6  How often does the pt experience symptoms? Frequently  How much have the symptoms interfered with usual daily activities? Quite a bit  How has condition changed since care began at this facility? NA - initial visit  In general, how is the patients overall health? Good   BACK PAIN (STarT Back Screening Tool) No   Arlester Bence, PT, DPT  06/22/2023 4:40 PM

## 2023-06-23 ENCOUNTER — Ambulatory Visit

## 2023-06-23 DIAGNOSIS — M12811 Other specific arthropathies, not elsewhere classified, right shoulder: Secondary | ICD-10-CM

## 2023-06-23 DIAGNOSIS — M25511 Pain in right shoulder: Secondary | ICD-10-CM | POA: Diagnosis not present

## 2023-06-23 DIAGNOSIS — H31092 Other chorioretinal scars, left eye: Secondary | ICD-10-CM | POA: Diagnosis not present

## 2023-06-23 DIAGNOSIS — H353211 Exudative age-related macular degeneration, right eye, with active choroidal neovascularization: Secondary | ICD-10-CM | POA: Diagnosis not present

## 2023-06-23 DIAGNOSIS — M6281 Muscle weakness (generalized): Secondary | ICD-10-CM | POA: Diagnosis not present

## 2023-06-23 DIAGNOSIS — H43813 Vitreous degeneration, bilateral: Secondary | ICD-10-CM | POA: Diagnosis not present

## 2023-06-23 DIAGNOSIS — H353122 Nonexudative age-related macular degeneration, left eye, intermediate dry stage: Secondary | ICD-10-CM | POA: Diagnosis not present

## 2023-06-23 DIAGNOSIS — G8929 Other chronic pain: Secondary | ICD-10-CM

## 2023-06-23 DIAGNOSIS — H15833 Staphyloma posticum, bilateral: Secondary | ICD-10-CM | POA: Diagnosis not present

## 2023-06-23 NOTE — Therapy (Signed)
 OUTPATIENT PHYSICAL THERAPY TREATMENT NOTE   Patient Name: Virginia Turner MRN: [id number] DOB:[birth year], 73 y.o., female Today's Date: 06/23/2023  END OF SESSION:  PT End of Session - 06/23/23 1343     Visit Number 5    Number of Visits 12    Date for PT Re-Evaluation 07/31/23    Authorization Type [employer] MCR    PT Start Time 1343    PT Stop Time 1421    PT Time Calculation (min) 38 min    Activity Tolerance Patient tolerated treatment well    Behavior During Therapy WFL for tasks assessed/performed               History reviewed. No pertinent past medical history. History reviewed. No pertinent surgical history. 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  Hand dominance: Right  PERTINENT HISTORY: None available  PAIN:  Are you having pain? Yes: NPRS scale: 9/10 Pain location: R shoulder Pain description: sharp Aggravating factors: OH reach, lifting  Relieving factors: rest   PRECAUTIONS: Other: RC tear(?)  RED FLAGS: None   WEIGHT BEARING RESTRICTIONS: No  FALLS:  Has patient fallen in last 6 months? No  OCCUPATION: retired  PLOF: Independent  PATIENT GOALS: To manage my shoulder pain  NEXT MD VISIT:   OBJECTIVE:  Note: Objective measures were completed at Evaluation unless  otherwise noted.  DIAGNOSTIC FINDINGS:  none  PATIENT SURVEYS:  Quick Dash 32/55  POSTURE: Rounded shoulders  UPPER EXTREMITY ROM:   A/PROM Right eval Left eval  Shoulder flexion 110/110d   Shoulder extension    Shoulder abduction 125/90d   Shoulder adduction    Shoulder internal rotation /70d   Shoulder external rotation /70d   Elbow flexion    Elbow extension    Wrist flexion    Wrist extension    Wrist ulnar deviation    Wrist radial deviation    Wrist pronation    Wrist supination    (  Blank rows = not tested)  UPPER EXTREMITY MMT:  MMT Right eval Left eval  Shoulder flexion 4-   Shoulder extension    Shoulder abduction 4-   Shoulder adduction    Shoulder internal rotation    Shoulder external rotation    Middle trapezius    Lower trapezius    Elbow flexion    Elbow extension    Wrist flexion    Wrist extension    Wrist ulnar deviation    Wrist radial deviation    Wrist pronation    Wrist supination    Grip strength (lbs)    (Blank rows = not tested)  SHOULDER SPECIAL TESTS: Impingement tests: Neer impingement test: positive  and Hawkins/Kennedy impingement test: positive   Rotator cuff assessment: Empty can test: positive , Full can test: positive , and Hornblower's sign: positive    JOINT MOBILITY TESTING:  deferred  PALPATION:  unremarkable                                                                                                                             TREATMENT DATE:    [facility] Adult PT Treatment:                                                DATE: 06/23/2023  Therapeutic Exercise: Nustep L4 8 min Finger ladder x 6 flexion Finger ladder x 6 abduction Hold off on prone exercises d/t eye procedure this morning  Prone extension 15x Prone scaption 15x Prone horiz abd 15x Prone row 15x Seated horizontal abduction 2 x 10, red TB Seated shoulder flexion 2 x 10 2lb dumbbell Updated and reviewed HEP  Shoulder isometrics, 5 sec  hold x 4 each direction     [facility] Adult PT Treatment:                                                DATE: 06/21/2023  Therapeutic Exercise: Nustep L3 8 min  Neuromuscular re-ed: PNF D1 F/E 10x Prone extension 15x Prone scaption 15x Prone horiz abd 15x Prone row 15x  Manual Therapy:  Supine flexion 15x Ranger Supine press 15x Ranger INF/POST/DISTRACT mobs 5x10   [facility] Adult PT Treatment:                                                DATE: 06/16/23 Therapeutic Exercise: Nustep L3 8 min Neuromuscular re-ed: 4 way scapula 15x PNF D1 F/E 15x Prone extension 15x Prone scaption 15x Prone hor abd 15x Prone row 15x Therapeutic Activity: Supine flexion 15x  Ranger Supine press 15x Ranger INF/POST/DISTRACT mobs 5x10     PATIENT EDUCATION: Education details: Discussed eval findings, rehab rationale and POC and patient is in agreement  Person educated: Patient Education method: Explanation Education comprehension: verbalized understanding and needs further education  HOME EXERCISE PROGRAM: Access Code: [email or web address] URL: [email or web address] Date: 06/24/2023 Prepared by: Arlester Bence  Exercises - Supine Shoulder Horizontal Abduction with Resistance  - 1 x daily - 2 sets - 10 reps - Supine Shoulder External Rotation with Resistance  - 1 x daily - 2 sets - 10 reps - Standing Shoulder Scaption  - 1 x daily - 2 sets - 10 reps - Isometric Shoulder Extension at Wall  - 1 x daily - 1 sets - 15 reps - 5 sec hold - Isometric Shoulder Flexion at Wall  - 1 x daily - 1 sets - 15 reps - 5 sec hold - Isometric Shoulder Abduction at Wall  - 1 x daily - 1 sets - 15 reps - 5 sec hold - Isometric Shoulder Adduction  - 1 x daily - 1 sets - 15 reps - 5 sec hold  ASSESSMENT:  CLINICAL IMPRESSION:   Virginia Turner had good tolerance of today's treatment session, which focused on progression of shoulder AROM and scapular stabilization activities. She will benefit from progression of  current POC as tolerated. Plan to resume and progression prone activities at next visit.    Patient is a 73 y.o. female who was seen today for physical therapy evaluation and treatment for chronic R shoulder pain. Patient presents with limited AROM and PROM, R RC weakness and positive signs of impingement and possible partial RC tear.  Prognosis is fair based on chronicity, unsuccessful CSI and possible tear.  OBJECTIVE IMPAIRMENTS: decreased knowledge of condition, decreased mobility, decreased ROM, decreased strength, impaired UE functional use, and pain.   ACTIVITY LIMITATIONS: carrying, lifting, sleeping, dressing, and reach over head  PERSONAL FACTORS: Age, Past/current experiences, and Time since onset of injury/illness/exacerbation are also affecting patient's functional outcome.   REHAB POTENTIAL: Fair due to chronicity and suspected   CLINICAL DECISION MAKING: Evolving/moderate complexity  EVALUATION COMPLEXITY: Low   GOALS: Goals reviewed with patient? No  SHORT TERM GOALS: Target date: 06/21/2023    Patient to demonstrate independence in HEP  Baseline:  [id number] Goal status: INITIAL  2.  160d AROM flexion/abd Baseline:  A/PROM Right eval Left eval  Shoulder flexion 110/110d   Shoulder extension    Shoulder abduction 125/90d    Goal status: INITIAL   LONG TERM GOALS: Target date: 07/12/2023  Patient will acknowledge 6/10 pain at least once during episode of care   Baseline: 9/10 Goal status: INITIAL  2.  Patient will score at least 25/55 on DASH to signify clinically meaningful improvement in functional abilities.   Baseline: 32/55 Goal status: INITIAL  3.  Increase R shoulder strength to 4/5 Baseline:  MMT Right eval Left eval  Shoulder flexion 4-   Shoulder extension    Shoulder abduction 4-    Goal status: INITIAL    PLAN:  PT FREQUENCY: 1-2x/week  PT DURATION: 6 weeks  PLANNED INTERVENTIONS: 97164- PT Re-evaluation, 97110-Therapeutic  exercises, 97530- Therapeutic activity, 97112- Neuromuscular re-education, 97535- Self Care, 13086- Manual therapy, and Patient/Family education  PLAN FOR NEXT SESSION: HEP review and update, manual techniques as appropriate, aerobic tasks, ROM and flexibility activities, strengthening and PREs, TPDN, gait and balance training as needed    Date of referral: 05/02/23 Referring provider: Shermon Divine, MD Referring  diagnosis? R shoulder rc  tear Treatment diagnosis? (if different than referring diagnosis) R shoulder rc  tear  What was this (referring dx) caused by? Ongoing Issue  Lonne Roan of Condition: Chronic (continuous duration > 3 months)   Laterality: Rt  Current Functional Measure Score: DASH 32/55  Objective measurements identify impairments when they are compared to normal values, the uninvolved extremity, and prior level of function.  [x]  Yes  []  No  Objective assessment of functional ability: Moderate functional limitations   Briefly describe symptoms: pain with RUE motion  How did symptoms start: insidiously  Average pain intensity:  Last 24 hours: 6  Past week: 6  How often does the pt experience symptoms? Frequently  How much have the symptoms interfered with usual daily activities? Quite a bit  How has condition changed since care began at this facility? NA - initial visit  In general, how is the patients overall health? Good   BACK PAIN (STarT Back Screening Tool) No   Arlester Bence, PT, DPT  06/24/2023 1:43 PM

## 2023-06-28 ENCOUNTER — Ambulatory Visit

## 2023-06-28 DIAGNOSIS — M6281 Muscle weakness (generalized): Secondary | ICD-10-CM | POA: Diagnosis not present

## 2023-06-28 DIAGNOSIS — G8929 Other chronic pain: Secondary | ICD-10-CM

## 2023-06-28 DIAGNOSIS — M25511 Pain in right shoulder: Secondary | ICD-10-CM | POA: Diagnosis not present

## 2023-06-28 DIAGNOSIS — M12811 Other specific arthropathies, not elsewhere classified, right shoulder: Secondary | ICD-10-CM | POA: Diagnosis not present

## 2023-06-28 NOTE — Therapy (Signed)
 OUTPATIENT PHYSICAL THERAPY TREATMENT NOTE   Patient Name: Virginia Turner MRN: [id number] DOB:[birth year], 73 y.o., female Today's Date: 06/28/2023  END OF SESSION:  PT End of Session - 06/28/23 1406     Visit Number 6    Number of Visits 12    Date for PT Re-Evaluation 07/31/23    Authorization Type [employer] MCR    PT Start Time 1400    PT Stop Time 1439    PT Time Calculation (min) 39 min    Activity Tolerance Patient tolerated treatment well    Behavior During Therapy WFL for tasks assessed/performed                History reviewed. No pertinent past medical history. History reviewed. No pertinent surgical history. 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   Hand dominance: Right  PERTINENT HISTORY: None available  PAIN:  Are you having pain? Yes: NPRS scale: 9/10 Pain location: R shoulder Pain description: sharp Aggravating factors: OH reach, lifting  Relieving factors: rest   PRECAUTIONS: Other: RC tear(?)  RED FLAGS: None   WEIGHT BEARING RESTRICTIONS: No  FALLS:  Has patient fallen in last 6 months? No  OCCUPATION: retired  PLOF: Independent  PATIENT GOALS: To manage my shoulder pain  NEXT MD VISIT:   OBJECTIVE:  Note: Objective measures were completed at Evaluation unless otherwise noted.  DIAGNOSTIC FINDINGS:  none  PATIENT SURVEYS:   Quick Dash 32/55  POSTURE: Rounded shoulders  UPPER EXTREMITY ROM:   A/PROM Right eval Left eval  Shoulder flexion 110/110d   Shoulder extension    Shoulder abduction 125/90d   Shoulder adduction    Shoulder internal rotation /70d   Shoulder external rotation /70d   Elbow flexion    Elbow extension    Wrist flexion    Wrist extension    Wrist ulnar deviation    Wrist radial deviation    Wrist pronation    Wrist supination    (Blank rows = not tested)  UPPER EXTREMITY MMT:  MMT Right eval Left eval  Shoulder flexion 4-   Shoulder extension    Shoulder abduction 4-   Shoulder adduction    Shoulder internal rotation    Shoulder external rotation    Middle trapezius    Lower trapezius    Elbow flexion    Elbow extension    Wrist flexion    Wrist extension    Wrist ulnar deviation    Wrist radial deviation    Wrist pronation    Wrist supination    Grip strength (lbs)    (Blank rows = not tested)  SHOULDER SPECIAL TESTS: Impingement tests: Neer impingement test: positive  and Hawkins/Kennedy impingement test: positive   Rotator cuff assessment: Empty can test: positive , Full can test: positive , and Hornblower's sign: positive    JOINT MOBILITY TESTING:  deferred  PALPATION:  unremarkable                                                                                                                             TREATMENT DATE:    [facility] Adult PT Treatment:                                                DATE: 06/28/2023  Therapeutic Exercise: UBE fwd/3 min back, x lvl 1  Finger ladder x 8 flexion Finger ladder x 8 abduction Seated horizontal abduction 2 x 10, red TB Seated shoulder flexion 2 x 10 2lb dumbbell Shoulder isometrics with  pillow, 5 sec hold x 5 each direction   [facility] Adult PT Treatment:                                                DATE: 06/23/2023  Therapeutic Exercise: Nustep L4 8 min Finger ladder x 6 flexion Finger ladder  x 6 abduction Hold off on prone exercises d/t eye procedure this morning  Prone extension 15x Prone scaption 15x Prone horiz abd 15x Prone row 15x Seated horizontal abduction 2 x 10, red TB Seated shoulder flexion 2 x 10 2lb dumbbell Updated and reviewed HEP  Shoulder isometrics, 5 sec hold x 4 each direction     [facility] Adult PT Treatment:                                                DATE: 06/21/2023  Therapeutic Exercise: Nustep L3 8 min  Neuromuscular re-ed: PNF D1 F/E 10x Prone extension 15x Prone scaption 15x Prone horiz abd 15x Prone row 15x  Manual Therapy:  Supine flexion 15x Ranger Supine  press 15x Ranger INF/POST/DISTRACT mobs 5x10      PATIENT EDUCATION: Education details: Discussed eval findings, rehab rationale and POC and patient is in agreement  Person educated: Patient Education method: Explanation Education comprehension: verbalized understanding and needs further education  HOME EXERCISE PROGRAM: Access Code: [email or web address] URL: [email or web address] Date: 06/24/2023 Prepared by: Arlester Bence  Exercises - Supine Shoulder Horizontal Abduction with Resistance  - 1 x daily - 2 sets - 10 reps - Supine Shoulder External Rotation with Resistance  - 1 x daily - 2 sets - 10 reps - Standing Shoulder Scaption  - 1 x daily - 2 sets - 10 reps - Isometric Shoulder Extension at Wall  - 1 x daily - 1 sets - 15 reps - 5 sec hold - Isometric Shoulder Flexion at Wall  - 1 x daily - 1 sets - 15 reps - 5 sec hold - Isometric Shoulder Abduction at Wall  - 1 x daily - 1 sets - 15 reps - 5 sec hold - Isometric Shoulder Adduction  - 1 x daily - 1 sets - 15 reps - 5 sec hold  ASSESSMENT:  CLINICAL IMPRESSION:   Virginia Turner had good tolerance of today's treatment session, which focused on progression of shoulder AROM and scapular stabilization activities. Spent additional time cueing for shoulder isometrics to improve independence at home. Patient reports  readiness for discharge at next visit, and would like to go over a plan for returning to the gym. We will reassess objective measures and goals at next visit.   Patient is a 73 y.o. female who was seen today for physical therapy evaluation and treatment for chronic R shoulder pain. Patient presents with limited AROM and PROM, R RC weakness and positive signs of impingement and possible partial RC tear.  Prognosis is fair based on chronicity, unsuccessful CSI and possible tear.  OBJECTIVE IMPAIRMENTS: decreased knowledge of condition, decreased mobility, decreased ROM, decreased strength, impaired UE functional use, and pain.   ACTIVITY LIMITATIONS: carrying, lifting, sleeping, dressing, and reach over head  PERSONAL FACTORS: Age, Past/current experiences, and Time since onset of injury/illness/exacerbation are also affecting patient's functional outcome.   REHAB POTENTIAL: Fair due to chronicity and suspected   CLINICAL DECISION MAKING: Evolving/moderate complexity  EVALUATION COMPLEXITY: Low   GOALS: Goals reviewed with patient? No  SHORT TERM GOALS: Target date: 06/21/2023    Patient to demonstrate independence in HEP  Baseline:  [id number] Goal status: INITIAL  2.  160d AROM flexion/abd Baseline:  A/PROM Right eval Left eval  Shoulder flexion 110/110d   Shoulder extension    Shoulder abduction 125/90d    Goal status: INITIAL   LONG TERM GOALS: Target date: 07/12/2023  Patient will acknowledge 6/10 pain at least once during episode of care   Baseline: 9/10 Goal status: INITIAL  2.  Patient will score at least 25/55 on DASH to signify clinically meaningful improvement in functional abilities.   Baseline: 32/55 Goal status: INITIAL  3.  Increase R shoulder strength to 4/5 Baseline:  MMT Right eval Left eval  Shoulder flexion 4-   Shoulder extension    Shoulder abduction 4-    Goal status: INITIAL    PLAN:  PT FREQUENCY: 1-2x/week  PT DURATION: 6  weeks  PLANNED INTERVENTIONS: 97164- PT Re-evaluation, 97110-Therapeutic exercises, 97530- Therapeutic activity, 97112- Neuromuscular re-education, 97535- Self Care, 30865- Manual therapy, and Patient/Family education  PLAN FOR NEXT SESSION: HEP review and update, manual techniques as appropriate, aerobic tasks, ROM and flexibility activities, strengthening and PREs,  TPDN, gait and balance training as needed    Date of referral: 05/02/23 Referring provider: Shermon Divine, MD Referring diagnosis? R shoulder rc  tear Treatment diagnosis? (if different than referring diagnosis) R shoulder rc  tear  What was this (referring dx) caused by? Ongoing Issue  Lonne Roan of Condition: Chronic (continuous duration > 3 months)   Laterality: Rt  Current Functional Measure Score: DASH 32/55  Objective measurements identify impairments when they are compared to normal values, the uninvolved extremity, and prior level of function.  [x]  Yes  []  No  Objective assessment of functional ability: Moderate functional limitations   Briefly describe symptoms: pain with RUE motion  How did symptoms start: insidiously  Average pain intensity:  Last 24 hours: 6  Past week: 6  How often does the pt experience symptoms? Frequently  How much have the symptoms interfered with usual daily activities? Quite a bit  How has condition changed since care began at this facility? NA - initial visit  In general, how is the patients overall health? Good   BACK PAIN (STarT Back Screening Tool) No   Arlester Bence, PT, DPT  06/28/2023 2:46 PM

## 2023-06-30 ENCOUNTER — Ambulatory Visit

## 2023-06-30 DIAGNOSIS — M6281 Muscle weakness (generalized): Secondary | ICD-10-CM | POA: Diagnosis not present

## 2023-06-30 DIAGNOSIS — G8929 Other chronic pain: Secondary | ICD-10-CM

## 2023-06-30 DIAGNOSIS — M25511 Pain in right shoulder: Secondary | ICD-10-CM | POA: Diagnosis not present

## 2023-06-30 DIAGNOSIS — M12811 Other specific arthropathies, not elsewhere classified, right shoulder: Secondary | ICD-10-CM | POA: Diagnosis not present

## 2023-06-30 NOTE — Therapy (Signed)
 OUTPATIENT PHYSICAL THERAPY TREATMENT NOTE   Patient Name: Virginia Turner MRN: [id number] DOB:1950/02/07, 73 y.o., female Today's Date: 06/30/2023   PHYSICAL THERAPY DISCHARGE SUMMARY  Visits from Start of Care: 6   Current functional level related to goals / functional outcomes: See objective findings/assessment   Remaining deficits: See objective findings/assessment    Education / Equipment: See today's treatment/assessment      Patient agrees to discharge. Patient goals were met. Patient is being discharged due to being pleased with the current functional level.    END OF SESSION:    PT End of Session - 06/28/23 1406       Visit Number 6     Number of Visits 12     Date for PT Re-Evaluation 07/31/23     Authorization Type [employer] MCR     PT Start Time 1400     PT Stop Time 1439     PT Time Calculation (min) 39 min     Activity Tolerance Patient tolerated treatment well     Behavior During Therapy WFL for tasks assessed/performed         No past medical history on file. No past surgical history on file. 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 She would like to spend time reviewing exercises she can do at the gym to maintain her progress.   Hand dominance: Right  PERTINENT HISTORY: None available  PAIN:  Are you having pain? Yes: NPRS scale: 9/10 Pain location: R  shoulder Pain description: sharp Aggravating factors: OH reach, lifting  Relieving factors: rest   PRECAUTIONS: Other: RC tear(?)  RED FLAGS: None   WEIGHT BEARING RESTRICTIONS: No  FALLS:  Has patient fallen in last 6 months? No  OCCUPATION: retired  PLOF: Independent  PATIENT GOALS: To manage my shoulder pain  NEXT MD VISIT:   OBJECTIVE:  Note: Objective measures were completed at Evaluation unless otherwise noted.  DIAGNOSTIC FINDINGS:  none  PATIENT SURVEYS:  Quick Dash 32/55  POSTURE: Rounded shoulders  UPPER EXTREMITY ROM:  A/PROM Right eval Right 06/30/2023  Shoulder flexion 110/110d [facility]  Shoulder extension    Shoulder abduction 125/90d [facility]  Shoulder adduction    Shoulder internal rotation /70d   Shoulder external rotation /70d   Elbow flexion    Elbow extension    Wrist flexion    Wrist extension    Wrist ulnar deviation    Wrist radial deviation    Wrist pronation    Wrist supination    (Blank rows = not tested)  UPPER EXTREMITY MMT:  MMT Right eval Right 06/30/2023  Shoulder flexion 4- 4+  Shoulder extension    Shoulder abduction 4- 4+  Shoulder adduction    Shoulder internal rotation  4+  Shoulder external rotation  4+  Middle trapezius    Lower trapezius    Elbow flexion    Elbow extension    Wrist flexion    Wrist extension    Wrist ulnar deviation    Wrist radial deviation    Wrist pronation    Wrist supination    Grip strength (lbs)    (Blank rows = not tested)  SHOULDER SPECIAL TESTS: Impingement tests: Neer impingement test: positive  and Hawkins/Kennedy impingement test: positive   Rotator cuff assessment: Empty can test: positive , Full can test: positive , and Hornblower's sign: positive    JOINT MOBILITY TESTING:  deferred  PALPATION:  unremarkable                                                                                                                             TREATMENT DATE:   [facility] Adult PT  Treatment:                                                DATE: 06/30/2023  Therapeutic Activity:  Reassessment of objective measures and subjective assessment regarding progress towards established goals and plan for independence with prescribed home program following discharged from PT 10 repetitions of all exercises for new "gym" HEP as noted below     PATIENT EDUCATION: Education details: Discussed eval findings, rehab rationale and POC and patient is in agreement  Person educated: Patient Education method: Explanation Education comprehension: verbalized understanding and needs further education  HOME EXERCISE PROGRAM: Access Code: [email or web address] URL: [email or web address] Date: 06/24/2023 Prepared by: Arlester Bence  Exercises - Supine Shoulder Horizontal Abduction with Resistance  - 1 x daily - 2 sets - 10 reps - Supine Shoulder External Rotation with Resistance  - 1 x daily - 2 sets - 10 reps - Standing Shoulder Scaption  - 1 x daily - 2 sets - 10 reps - Isometric Shoulder Extension at Wall  - 1 x daily - 1 sets - 15 reps - 5 sec hold - Isometric Shoulder Flexion at Wall  - 1 x daily - 1 sets - 15  reps - 5 sec hold - Isometric Shoulder Abduction at Wall  - 1 x daily - 1 sets - 15 reps - 5 sec hold - Isometric Shoulder Adduction  - 1 x daily - 1 sets - 15 reps - 5 sec hold  Access Code: [email or web address] URL: [email or web address] Date: 06/30/2023 Prepared by: Arlester Bence  Exercises - Elliptical  - 3 x weekly - Seated Row Cable Machine  - 3 x weekly - 2-3 sets - 8 reps - 20 pounds - Lat Pull Down  - 3 x weekly - 2-3 sets - 8 reps - 20 pounds - Triceps Extension   - 1 x daily - 3 x weekly - 2-3 sets - 10 reps - 13-15 pounds  ASSESSMENT:  CLINICAL IMPRESSION:   Valari is a 73 y.o. female has attended 6 visits since initial evaluation. Patient has made good progress with shoulder AROM and MMT score, and have met all rehab goals. She should continue with  prescribed home program. Patient will be discharged from skilled PT at this time and should follow up with referring provider as needed.      Patient is a 73 y.o. female who was seen today for physical therapy evaluation and treatment for chronic R shoulder pain. Patient presents with limited AROM and PROM, R RC weakness and positive signs of impingement and possible partial RC tear.  Prognosis is fair based on chronicity, unsuccessful CSI and possible tear.  OBJECTIVE IMPAIRMENTS: decreased knowledge of condition, decreased mobility, decreased ROM, decreased strength, impaired UE functional use, and pain.   ACTIVITY LIMITATIONS: carrying, lifting, sleeping, dressing, and reach over head  PERSONAL FACTORS: Age, Past/current experiences, and Time since onset of injury/illness/exacerbation are also affecting patient's functional outcome.   REHAB POTENTIAL: Fair due to chronicity and suspected   CLINICAL DECISION MAKING: Evolving/moderate complexity  EVALUATION COMPLEXITY: Low   GOALS: Goals reviewed with patient? No  SHORT TERM GOALS: Target date: 06/21/2023    Patient to demonstrate independence in HEP  Baseline:  [id number] Goal status:MET  2.  160d AROM flexion/abd Baseline:  A/PROM Right eval Left eval  Shoulder flexion 110/110d   Shoulder extension    Shoulder abduction 125/90d    Goal status: MET; full ROM at time of d/c    LONG TERM GOALS: Target date: 07/12/2023  Patient will acknowledge 6/10 pain at least once during episode of care   Baseline: 9/10 Goal status: MET; 5/10 at worst   2.  Patient will score at least 25/55 on DASH to signify clinically meaningful improvement in functional abilities.   Baseline: 32/55 06/30/23: 24/55  Goal status: MET  3.  Increase R shoulder strength to 4/5 Baseline:  MMT Right eval Left eval  Shoulder flexion 4-   Shoulder extension    Shoulder abduction 4-    Goal status: MET 4/5 R UE, 4+/5 L UE     PLAN:  PT  FREQUENCY: 1-2x/week  PT DURATION: 6 weeks  PLANNED INTERVENTIONS: 97164- PT Re-evaluation, 97110-Therapeutic exercises, 97530- Therapeutic activity, [id number]- Neuromuscular re-education, 97535- Self Care, [id number]- Manual therapy, and Patient/Family education  PLAN FOR NEXT SESSION: discharged from OP PT   Arlester Bence, PT, DPT  07/03/2023 6:27 PM

## 2023-08-07 ENCOUNTER — Telehealth: Payer: Self-pay

## 2023-08-07 NOTE — Telephone Encounter (Signed)
 I was able to get in touch with the patient's daughter and she brought her mother in on a group call. The patient states that the pharmacy quoted her $1000 for numluvio, and she is not able to pay. I reached out to [place], the patient access representative, who states that the patient has been approved for Low Income Subsidy, which should make the medication cost next to nothing. I relayed the information to the patient and provided the phone number to the pharmacy to call and get the updated price so they can dispense.

## 2023-08-29 ENCOUNTER — Ambulatory Visit: Admitting: Dermatology

## 2023-08-29 DIAGNOSIS — L2089 Other atopic dermatitis: Secondary | ICD-10-CM

## 2023-08-29 NOTE — Patient Instructions (Signed)

## 2023-08-29 NOTE — Progress Notes (Signed)
   Follow-Up Visit   Subjective  Virginia Turner is a 73 y.o. female who presents for the following: Atopic Dermatitis - Nemluvio  injection training today    The following portions of the chart were reviewed this encounter and updated as appropriate: medications, allergies, medical history  Review of Systems:  No other skin or systemic complaints except as noted in HPI or Assessment and Plan.  Objective  Well appearing patient in no apparent distress; mood and affect are within normal limits.  Areas Examined: Face, abdomen  Relevant physical exam findings are noted in the Assessment and Plan.    Assessment & Plan   1. Chronic Pruritus / Atopic Dermatitis - Assessment: Patient reports persistent, severe pruritus rated 10/10 on a pain scale. She has been using only moisturizer and prescribed soap for skincare. Previous treatments with triamcinolone and Zoryve  (roflumilast ) have been ineffective in managing symptoms. Patient is scheduled to start a new injectable medication regimen.  - Plan:    Initiate injectable medication regimen: week 0, week 4, then every 4 weeks - Nemluvio  injection today Lot # [id number] Exp 10/2025     Continue current topical regimen with moisturizers    Provide samples of Tolerance and Cicaplast moisturizers     - Instruct to use Tolerance in the morning and Cicaplast at night     - Educate on availability at [employer] if patient finds them effective    Refill Zoryve  (roflumilast ) prescription    Follow-up in 4 months to assess improvement in pruritus    Return in about 4 months (around 12/30/2023) for Atopic Dermatitis - Nemluvio .  I, Roseline Hutchinson, CMA, am acting as scribe for [employer], DO .    Documentation: I have reviewed the above documentation for accuracy and completeness, and I agree with the above.  Delon Lenis, DO

## 2023-08-30 MED ORDER — NEMOLIZUMAB-ILTO 30 MG ~~LOC~~ AUIJ
30.0000 mg | AUTO-INJECTOR | Freq: Once | SUBCUTANEOUS | Status: AC
  Administered 2023-08-29: 30 mg via SUBCUTANEOUS

## 2023-09-01 DIAGNOSIS — H353122 Nonexudative age-related macular degeneration, left eye, intermediate dry stage: Secondary | ICD-10-CM | POA: Diagnosis not present

## 2023-09-01 DIAGNOSIS — H43813 Vitreous degeneration, bilateral: Secondary | ICD-10-CM | POA: Diagnosis not present

## 2023-09-01 DIAGNOSIS — H353211 Exudative age-related macular degeneration, right eye, with active choroidal neovascularization: Secondary | ICD-10-CM | POA: Diagnosis not present

## 2023-09-01 DIAGNOSIS — H31092 Other chorioretinal scars, left eye: Secondary | ICD-10-CM | POA: Diagnosis not present

## 2023-09-01 DIAGNOSIS — H15833 Staphyloma posticum, bilateral: Secondary | ICD-10-CM | POA: Diagnosis not present

## 2023-09-05 ENCOUNTER — Encounter: Payer: Self-pay | Admitting: Dermatology

## 2023-09-26 DIAGNOSIS — E785 Hyperlipidemia, unspecified: Secondary | ICD-10-CM | POA: Diagnosis not present

## 2023-09-26 DIAGNOSIS — E782 Mixed hyperlipidemia: Secondary | ICD-10-CM | POA: Diagnosis not present

## 2023-09-26 DIAGNOSIS — I10 Essential (primary) hypertension: Secondary | ICD-10-CM | POA: Diagnosis not present

## 2023-09-26 DIAGNOSIS — E1169 Type 2 diabetes mellitus with other specified complication: Secondary | ICD-10-CM | POA: Diagnosis not present

## 2023-09-26 DIAGNOSIS — M25511 Pain in right shoulder: Secondary | ICD-10-CM | POA: Diagnosis not present

## 2023-09-28 DIAGNOSIS — E1169 Type 2 diabetes mellitus with other specified complication: Secondary | ICD-10-CM | POA: Diagnosis not present

## 2023-09-28 DIAGNOSIS — E782 Mixed hyperlipidemia: Secondary | ICD-10-CM | POA: Diagnosis not present

## 2023-09-28 DIAGNOSIS — I10 Essential (primary) hypertension: Secondary | ICD-10-CM | POA: Diagnosis not present

## 2023-09-28 DIAGNOSIS — L237 Allergic contact dermatitis due to plants, except food: Secondary | ICD-10-CM | POA: Diagnosis not present

## 2023-11-15 ENCOUNTER — Encounter: Payer: Self-pay | Admitting: *Deleted

## 2023-11-15 NOTE — Progress Notes (Signed)
 Virginia Turner                                          MRN: [id number]   11/15/2023   The [facility] Quality Team Specialist reviewed this patient medical record for the purposes of chart review for care gap closure. The following were reviewed: chart review for care gap closure-glycemic status assessment and kidney health evaluation for diabetes:eGFR  and uACR.    [facility] Quality Team

## 2023-12-20 ENCOUNTER — Ambulatory Visit: Admitting: Dermatology

## 2024-01-01 ENCOUNTER — Ambulatory Visit: Admitting: Dermatology

## 2024-01-01 ENCOUNTER — Encounter: Payer: Self-pay | Admitting: Dermatology

## 2024-01-01 VITALS — BP 113/75 | HR 82

## 2024-01-01 DIAGNOSIS — L299 Pruritus, unspecified: Secondary | ICD-10-CM

## 2024-01-01 DIAGNOSIS — L209 Atopic dermatitis, unspecified: Secondary | ICD-10-CM | POA: Diagnosis not present

## 2024-01-01 DIAGNOSIS — L810 Postinflammatory hyperpigmentation: Secondary | ICD-10-CM

## 2024-01-01 NOTE — Progress Notes (Signed)
   Follow-Up Visit   Subjective  Virginia Turner is a 73 y.o. female who presents for the following: Atopic Dermatitis/Prurigo Nodularis  Patient was last evaluated on 08/29/23.  At this visit patient was prescribed Nemluvio  and was given initial injection.  Patient recommended to continue Zoryve  Recommended to use Avene Tolerance and Cicalfate Patient reports sxs have greatly improved with Nemluvio   Patient rates itch 1/10 Patient reports she has had no flare ups since starting medication Patient reports she has been using Avene Tolerance and Cicalfate Patient reports she has not been using Triamcinolone  Patient reports she is using Dove soap Patient denies medication changes.  The following portions of the chart were reviewed this encounter and updated as appropriate: medications, allergies, medical history  Review of Systems:  No other skin or systemic complaints except as noted in HPI or Assessment and Plan.  Objective  Well appearing patient in no apparent distress; mood and affect are within normal limits.  A focused examination was performed of the following areas: Face  Relevant exam findings are noted in the Assessment and Plan.           Assessment & Plan    Atopic Dermatitis with Severe Pruritus Well-controlled with monthly Nemluvio  injections, resulting in significant improvement in itching and skin condition. Nemluvio  is effective and safe, not suppressing the immune system or interacting with other medications.  - Continue monthly Nemluvio  injections. - Monitor for any issues with Nemluvio  and contact the office if needed. - Will refill Nemluvio  prescription in the summer if necessary.  Postinflammatory hyperpigmentation of face Postinflammatory hyperpigmentation on the face is being addressed with a new skincare regimen. Eucerin Radiant Tone active dual serum is recommended to target excess pigment. Sunscreen is essential to prevent further darkening due  to UV exposure. Improvement expected in 4-6 months with consistent use.  - Use Eucerin Radiant Tone active dual serum morning and night. - Apply sunscreen regularly, preferably Eucerin's sunscreen. - Provided samples of Eucerin products if available.     Return in 1 year (on 12/31/2024) for Atopic Dermatitis/Prurigo Nodularis.  I, Lyle Cords, as acting as a neurosurgeon for [employer], DO .   Documentation: I have reviewed the above documentation for accuracy and completeness, and I agree with the above.  Delon Lenis, DO

## 2024-01-01 NOTE — Patient Instructions (Addendum)
 VISIT SUMMARY:  Today, we discussed your chronic itching and skin discoloration. You have experienced significant improvement in your itching since starting monthly Imuvia injections. We also addressed the hyperpigmentation on your face and your dry skin care routine.  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 Continue with the monthly Nemluvio  injections and monitor for any issues. Contact the office if you experience any problems. We will refill your Nemluvio  prescription in the summer if necessary.  -POSTINFLAMMATORY HYPERPIGMENTATION OF FACE:  Postinflammatory hyperpigmentation is darkening of the skin following inflammation or injury. To address this, use Eucerin Radiant Tone active dual serum both in the morning and at night.   It is also important to apply sunscreen regularly to prevent further darkening from UV exposure. Improvement is expected in 4-6 months with consistent use. Samples of Eucerin products were provided if available.  -XEROSIS CUTIS (DRY SKIN):  Xerosis cutis is a condition of dry skin. Continue using Dove soap and Jergens lotion as part of your moisturizing routine. Avoid using hot water and apply moisturizer within five minutes of towel drying to maintain skin hydration. You may also consider using baby oil for additional moisturizing, especially on your back.  INSTRUCTIONS:  Please continue with your current treatments and follow the new skincare regimen as discussed. If you experience any issues with Imuvia, contact our office. We will refill your Imuvia prescription in the summer if necessary.    Important Information  Due to recent changes in healthcare laws, you may see results of your pathology and/or laboratory studies on MyChart before the doctors have  had a chance to review them. We understand that in some cases there may be results that are confusing or concerning to you. Please understand that not all results are received at the same time and often the doctors may need to interpret multiple results in order to provide you with the best plan of care or course of treatment. Therefore, we ask that you please give us  2 business days to thoroughly review all your results before contacting the office for clarification. Should we see a critical lab result, you will be contacted sooner.   If You Need Anything After Your Visit  If you have any questions or concerns for your doctor, please call our main line at [phone] If no one answers, please leave a voicemail as directed and we will return your call as soon as possible. Messages left after 4 pm will be answered the following business day.   You may also send us  a message via MyChart. We typically respond to MyChart messages within 1-2 business days.  For prescription refills, please ask your pharmacy to contact our office. Our fax number is [phone].  If you have an urgent issue when the clinic is closed that cannot wait until the next business day, you can page your doctor at the number below.    Please note that while we do our best to be available for urgent issues outside of office hours, we are not available 24/7.   If you have an urgent issue and are unable to reach us , you may choose to seek medical care at your doctor's office, retail clinic, urgent care center, or emergency room.  If you have a medical emergency, please immediately call 911 or go to the emergency department. In the event of  inclement weather, please call our main line at [phone] for an update on the status of any delays or closures.  Dermatology Medication Tips: Please keep the boxes that topical medications come in in order to help keep track of the instructions about where and how to use these. Pharmacies  typically print the medication instructions only on the boxes and not directly on the medication tubes.   If your medication is too expensive, please contact our office at [phone] or send us  a message through MyChart.   We are unable to tell what your co-pay for medications will be in advance as this is different depending on your insurance coverage. However, we may be able to find a substitute medication at lower cost or fill out paperwork to get insurance to cover a needed medication.   If a prior authorization is required to get your medication covered by your insurance company, please allow us  1-2 business days to complete this process.  Drug prices often vary depending on where the prescription is filled and some pharmacies may offer cheaper prices.  The website www.goodrx.com contains coupons for medications through different pharmacies. The prices here do not account for what the cost may be with help from insurance (it may be cheaper with your insurance), but the website can give you the price if you did not use any insurance.  - You can print the associated coupon and take it with your prescription to the pharmacy.  - You may also stop by our office during regular business hours and pick up a GoodRx coupon card.  - If you need your prescription sent electronically to a different pharmacy, notify our office through [facility] or by phone at [phone]

## 2024-01-02 ENCOUNTER — Ambulatory Visit: Admitting: Dermatology

## 2024-01-02 NOTE — Progress Notes (Signed)
 Virginia Turner                                          MRN: [id number]   01/02/2024   The [facility] Quality Team Specialist reviewed this patient medical record for the purposes of chart review for care gap closure. The following were reviewed: chart review for care gap closure-glycemic status assessment and kidney health evaluation for diabetes:eGFR  and uACR.    [facility] Quality Team

## 2024-02-14 ENCOUNTER — Other Ambulatory Visit: Payer: Self-pay

## 2024-02-14 MED ORDER — NEMLUVIO 30 MG ~~LOC~~ AUIJ: 30.0000 mg | AUTO-INJECTOR | SUBCUTANEOUS | 11 refills | Status: AC

## 2025-01-01 ENCOUNTER — Ambulatory Visit: Admitting: Dermatology
